# Patient Record
Sex: Male | Born: 1950 | Race: White | Hispanic: No | Marital: Married | Smoking: Former smoker
Health system: Southern US, Community
[De-identification: ages and names within clinical notes are randomized; demographics above are authoritative.]

## PROBLEM LIST (undated history)

## (undated) DIAGNOSIS — M1990 Unspecified osteoarthritis, unspecified site: Secondary | ICD-10-CM

## (undated) DIAGNOSIS — I509 Heart failure, unspecified: Secondary | ICD-10-CM

## (undated) DIAGNOSIS — G4730 Sleep apnea, unspecified: Secondary | ICD-10-CM

## (undated) DIAGNOSIS — R51 Headache: Secondary | ICD-10-CM

## (undated) DIAGNOSIS — T7840XA Allergy, unspecified, initial encounter: Secondary | ICD-10-CM

## (undated) DIAGNOSIS — Z95810 Presence of automatic (implantable) cardiac defibrillator: Secondary | ICD-10-CM

## (undated) DIAGNOSIS — R519 Headache, unspecified: Secondary | ICD-10-CM

## (undated) DIAGNOSIS — E7800 Pure hypercholesterolemia, unspecified: Secondary | ICD-10-CM

## (undated) DIAGNOSIS — Z9889 Other specified postprocedural states: Secondary | ICD-10-CM

## (undated) HISTORY — PX: HERNIA REPAIR: SHX51

---
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
## 2003-08-31 ENCOUNTER — Other Ambulatory Visit: Payer: Self-pay

## 2004-04-22 HISTORY — PX: HAND TENDON SURGERY: SHX663

## 2006-01-15 ENCOUNTER — Ambulatory Visit: Payer: Self-pay

## 2006-07-17 ENCOUNTER — Emergency Department: Payer: Self-pay | Admitting: Emergency Medicine

## 2012-07-25 ENCOUNTER — Emergency Department: Payer: Self-pay | Admitting: Emergency Medicine

## 2012-07-25 LAB — CBC WITH DIFFERENTIAL/PLATELET
Basophil #: 0 10*3/uL (ref 0.0–0.1)
Basophil %: 0.3 %
MCH: 29.3 pg (ref 26.0–34.0)
MCHC: 34.5 g/dL (ref 32.0–36.0)
MCV: 85 fL (ref 80–100)
Monocyte #: 0.8 x10 3/mm (ref 0.2–1.0)
Neutrophil #: 8.2 10*3/uL — ABNORMAL HIGH (ref 1.4–6.5)
Neutrophil %: 86.4 %
Platelet: 171 10*3/uL (ref 150–440)
RBC: 4.98 10*6/uL (ref 4.40–5.90)
RDW: 12.8 % (ref 11.5–14.5)

## 2012-07-25 LAB — BASIC METABOLIC PANEL
Anion Gap: 4 — ABNORMAL LOW (ref 7–16)
Calcium, Total: 8.7 mg/dL (ref 8.5–10.1)
Chloride: 106 mmol/L (ref 98–107)
Co2: 27 mmol/L (ref 21–32)
Creatinine: 1.05 mg/dL (ref 0.60–1.30)
EGFR (African American): >60
EGFR (Non-African Amer.): >60
Potassium: 4.1 mmol/L (ref 3.5–5.1)
Sodium: 137 mmol/L (ref 136–145)

## 2012-07-26 ENCOUNTER — Ambulatory Visit: Payer: Self-pay | Admitting: Orthopedic Surgery

## 2012-07-26 ENCOUNTER — Inpatient Hospital Stay: Payer: Self-pay | Admitting: Internal Medicine

## 2012-07-26 LAB — CBC
HCT: 39.6 % — ABNORMAL LOW (ref 40.0–52.0)
MCHC: 34.4 g/dL (ref 32.0–36.0)
MCV: 85 fL (ref 80–100)
Platelet: 153 10*3/uL (ref 150–440)
RBC: 4.66 10*6/uL (ref 4.40–5.90)
RDW: 13.1 % (ref 11.5–14.5)
WBC: 10 10*3/uL (ref 3.8–10.6)

## 2012-07-26 LAB — URINALYSIS, COMPLETE
Bilirubin,UR: NEGATIVE
Glucose,UR: NEGATIVE mg/dL (ref 0–75)
Leukocyte Esterase: NEGATIVE
Nitrite: NEGATIVE
Ph: 6 (ref 4.5–8.0)
Protein: NEGATIVE
Squamous Epithelial: <1

## 2012-07-26 LAB — COMPREHENSIVE METABOLIC PANEL
BUN: 13 mg/dL (ref 7–18)
Calcium, Total: 8 mg/dL — ABNORMAL LOW (ref 8.5–10.1)
Chloride: 106 mmol/L (ref 98–107)
Glucose: 99 mg/dL (ref 65–99)
Potassium: 4 mmol/L (ref 3.5–5.1)
SGOT(AST): 17 U/L (ref 15–37)

## 2012-07-26 LAB — SEDIMENTATION RATE: Erythrocyte Sed Rate: 8 mm/hr (ref 0–20)

## 2012-07-26 LAB — TROPONIN I: Troponin-I: <0.02 ng/mL

## 2012-07-27 LAB — CBC WITH DIFFERENTIAL/PLATELET
Basophil #: 0 10*3/uL (ref 0.0–0.1)
Basophil %: 0.3 %
Eosinophil #: 0 10*3/uL (ref 0.0–0.7)
Eosinophil %: 0.2 %
HCT: 37.3 % — ABNORMAL LOW (ref 40.0–52.0)
Lymphocyte %: 8.6 %
MCH: 29.4 pg (ref 26.0–34.0)
MCHC: 34.7 g/dL (ref 32.0–36.0)
MCV: 85 fL (ref 80–100)
Monocyte #: 1.2 x10 3/mm — ABNORMAL HIGH (ref 0.2–1.0)
Monocyte %: 12.4 %
Neutrophil #: 7.4 10*3/uL — ABNORMAL HIGH (ref 1.4–6.5)
Neutrophil %: 78.5 %
Platelet: 146 10*3/uL — ABNORMAL LOW (ref 150–440)
RBC: 4.41 10*6/uL (ref 4.40–5.90)
RDW: 13.3 % (ref 11.5–14.5)
WBC: 9.4 10*3/uL (ref 3.8–10.6)

## 2012-07-27 LAB — BASIC METABOLIC PANEL
BUN: 11 mg/dL (ref 7–18)
Co2: 30 mmol/L (ref 21–32)
Creatinine: 1.05 mg/dL (ref 0.60–1.30)
Osmolality: 274 (ref 275–301)
Potassium: 4.2 mmol/L (ref 3.5–5.1)
Sodium: 137 mmol/L (ref 136–145)

## 2012-07-27 LAB — URINE CULTURE

## 2012-07-27 LAB — TROPONIN I: Troponin-I: <0.02 ng/mL

## 2012-07-28 LAB — VANCOMYCIN, TROUGH: Vancomycin, Trough: 12 ug/mL (ref 10–20)

## 2012-07-30 LAB — WOUND CULTURE

## 2012-08-01 LAB — CULTURE, BLOOD (SINGLE)

## 2012-11-17 ENCOUNTER — Ambulatory Visit: Payer: Self-pay | Admitting: Unknown Physician Specialty

## 2013-03-12 ENCOUNTER — Ambulatory Visit: Payer: Self-pay | Admitting: Otolaryngology

## 2013-04-01 ENCOUNTER — Ambulatory Visit: Payer: Self-pay | Admitting: Otolaryngology

## 2013-04-13 ENCOUNTER — Ambulatory Visit: Payer: Self-pay | Admitting: Otolaryngology

## 2013-06-08 ENCOUNTER — Ambulatory Visit: Payer: Self-pay | Admitting: Internal Medicine

## 2013-07-30 ENCOUNTER — Ambulatory Visit: Payer: Self-pay | Admitting: Internal Medicine

## 2013-07-30 LAB — CBC CANCER CENTER
BASOS ABS: 0 x10 3/mm (ref 0.0–0.1)
BASOS PCT: 0.8 %
EOS ABS: 0.2 x10 3/mm (ref 0.0–0.7)
Eosinophil %: 3.3 %
HCT: 43.7 % (ref 40.0–52.0)
HGB: 14.5 g/dL (ref 13.0–18.0)
LYMPHS ABS: 0.9 x10 3/mm — AB (ref 1.0–3.6)
Lymphocyte %: 18.2 %
MCH: 28.2 pg (ref 26.0–34.0)
MCHC: 33.2 g/dL (ref 32.0–36.0)
MCV: 85 fL (ref 80–100)
Monocyte #: 0.4 x10 3/mm (ref 0.2–1.0)
Monocyte %: 8 %
NEUTROS ABS: 3.5 x10 3/mm (ref 1.4–6.5)
Neutrophil %: 69.7 %
PLATELETS: 193 x10 3/mm (ref 150–440)
RBC: 5.15 10*6/uL (ref 4.40–5.90)
RDW: 13.5 % (ref 11.5–14.5)
WBC: 5 x10 3/mm (ref 3.8–10.6)

## 2013-07-30 LAB — IRON AND TIBC
IRON BIND. CAP.(TOTAL): 342 ug/dL (ref 250–450)
Iron Saturation: 25 %
Iron: 84 ug/dL (ref 65–175)
Unbound Iron-Bind.Cap.: 258 ug/dL

## 2013-08-02 LAB — PROT IMMUNOELECTROPHORES(ARMC)

## 2013-08-02 LAB — KAPPA/LAMBDA FREE LIGHT CHAINS (ARMC)

## 2013-08-20 ENCOUNTER — Ambulatory Visit: Payer: Self-pay | Admitting: Internal Medicine

## 2014-05-12 HISTORY — PX: OTHER SURGICAL HISTORY: SHX169

## 2014-08-12 NOTE — Consult Note (Signed)
Consult was dictated, patient is well known to me. He has ruled out for MI, and has LBBB, of uncertain age. Chest pain yesterday was atypical due to probably b/l lower lobe pneumonia. His right knee is being treated with antiobiotics, and if needs surgery advise proceeding with surgery. He is low risk for surgery, as LVEF on echo was around 50 %. No significant valvular abnormalities and no significant wall motion abnormalities. Will see out patient if further cardiac testing needed. Advise getting over acute phase of treatment for pneumonia and knee infection first. Thanks.   Electronic Signatures: Angelica Ran (MD) (Signed on 07-Apr-14 16:49)  Authored   Last Updated: 07-Apr-14 16:50 by Angelica Ran (MD)

## 2014-08-12 NOTE — H&P (Signed)
DATE OF BIRTH:  06/19/1950  DATE OF ADMISSION:  07/26/2012  PRIMARY CARE PHYSICIAN:  Dr. Apolonio Schneiders  CHIEF COMPLAINT:  "I thought I had the flu."   HISTORY OF PRESENT ILLNESS:  This is a 64 year old man who last Tuesday or Wednesday felt like he had the flu, very weak, not feeling well. Friday and Saturday, he felt terrible with major headache. Nothing would help. Nausea, cold chills and fever. Had some diarrhea on the weekend. On Saturday, he noticed his knee was red. He came to the ER. 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 He had a cortisone shot actually a couple of months ago by Dr. Tamala Julian. He came to the ER on Saturday, was given antibiotic to go home with, but had a fever of 101.6, worsening erythema around the swollen knee. Hospitalist services were contacted for further evaluation.   PAST MEDICAL HISTORY:  Sleep apnea.   PAST SURGICAL HISTORY:  Knee surgery, two hernias.   ALLERGIES:  No known drug allergies.   MEDICATIONS:  Include Claritin 10 mg daily, etodolac twice a day, Flonase 2 sprays each nostril daily, over-the-counter sinus medication, ibuprofen PM, and CPAP at night.   SOCIAL HISTORY: Quit smoking 37 years ago. Rare alcohol. No drug use. Works in Architect.   FAMILY HISTORY:  Father died of lung cancer. Mother is healthy. Brother just died of metastatic esophageal cancer.  Another brother has a benign tumor on the carotid artery.   REVIEW OF SYSTEMS:   CONSTITUTIONAL: Positive for fever. Positive for chills. Positive for sweats. Positive for weight loss, trying to do so. Positive for fatigue. Positive for headache.  EYES: He does wear glasses. No blurry vision.  EARS, NOSE, MOUTH AND THROAT:  Decreased hearing. Positive for runny nose. No sore throat. No difficulty swallowing.  CARDIOVASCULAR:  No chest pain. No palpitations.  RESPIRATORY:  Positive for shortness of breath. Positive for cough.   GASTROINTESTINAL:  Positive for nausea. Positive for vomiting. Positive for diarrhea. No bright red blood per rectum. No melena. GENITOURINARY:  No burning on urination. No hematuria.  MUSCULOSKELETAL: Positive for joint pain of the right knee and joint pains all over.  INTEGUMENT:  Positive for erythema of the right knee.  NEUROLOGICAL:  No fainting or blackouts.  PSYCHIATRIC:  No anxiety or depression.  ENDOCRINE:  No thyroid problems.  HEMATOLOGIC, LYMPHATIC:  No anemia. No easy bruising or bleeding.   PHYSICAL EXAMINATION: VITAL SIGNS: Temperature 100.4, pulse 78, respirations 16, blood pressure 125/69, pulse ox 96% on room air.  GENERAL:  Ill-looking male lying in bed.  EYES: Conjunctivae and lids normal. Pupils equal, round and reactive to light. Extraocular muscles intact. No nystagmus.  EARS, NOSE, MOUTH AND THROAT: Tympanic membranes bilaterally erythematous and bulging. Nasal mucosa:  No erythema. Throat:  No erythema, no exudate seen. Lips and gums:  No lesions.  NECK: No JVD. No bruits. No lymphadenopathy. No thyromegaly. No thyroid nodules palpated.  RESPIRATORY:  Lungs clear to auscultation. No use of accessory muscles to breathe. No rhonchi, rales or wheeze heard.  CARDIOVASCULAR SYSTEM:  S1, S2 normal. No gallops, rubs or murmurs heard. Carotid upstroke 2+ bilaterally. No bruits.  EXTREMITIES: Dorsalis pedis pulses 2+ bilaterally. No edema of the lower extremity.  ABDOMEN: Soft, nontender. No organo-/splenomegaly. Normoactive bowel sounds. No masses felt.  LYMPHATIC:  No lymph nodes in the neck. Positive lymph nodes in the right groin.  MUSCULOSKELETAL:  Right knee swollen. Painful range of motion secondary to swelling in the prepatellar bursa.  SKIN:  Erythema around the right knee.  NEUROLOGIC:  Cranial nerves II through XII grossly intact.  PSYCHIATRIC:  Patient is oriented to person, place and time.   LABORATORY AND RADIOLOGICAL DATA:  Urinalysis negative. White blood  cell count 10.0, H and H 13.7 and 39.6, platelet count 153. Glucose 99, BUN 13, creatinine 0.95, sodium 138, potassium 4.0, chloride 106, CO2 of 27, calcium 8.0. Liver function tests:  Normal range. Lactic acid 0.7. Chest x-ray shows mild atelectasis versus pneumonia of the left lung base. CT scan of the head done yesterday showed no acute intracranial abnormality.   ASSESSMENT AND PLAN: 1.  Infected patellar bursa. Emergency Room physician did an incision and drainage and sent off a culture. Will get Orthopedic evaluation for consideration of incision and drainage. Will give IV vancomycin and IV Levaquin at this point. Will get an ultrasound of the right lower extremity to rule out deep vein thrombosis. Blood cultures x 2 sent off. Will send off a CRP and ESR.  2.  Upper respiratory tract infection with otitis media, possibility of pneumonia. Levaquin will cover. Blood cultures sent off by ER physician.  3. Headache, likely cluster headache. Oxygen made it feel better. Will continue oxygen supplementation, p.r.n. pain medications.  4.  Sleep apnea. CPAP at night.    Time spent on admission:  55 minutes.    ____________________________ Tana Conch. Leslye Peer, MD rjw:mr D: 07/26/2012 18:47:00 ET T: 07/26/2012 19:04:07 ET JOB#: [id number]  cc: Tana Conch. Leslye Peer, MD, <Dictator> Vianne Bulls. Arline Asp, MD Marisue Brooklyn MD ELECTRONICALLY SIGNED 07/27/2012 15:21

## 2014-08-12 NOTE — Consult Note (Signed)
Brief Consult Note: Diagnosis: CP with new L BBB.   Patient was seen by consultant.   Consult note dictated.   Comments: Patient with fever, R knee pain/swelling and septic arthritis. He has SOB with inspiration as well as SOB with activity and fatigue. EKG with NSR new LBBB compared to 2007. TNI neg thus far. Will get echo to check wall motion abnormality and will consider Lexiscan stress testing.  Electronic Signatures: Angelica Ran (MD)   (Signed 07-Apr-14 16:40)  Co-Signer: Brief Consult Note Gabriel Rung A (PA-C)   (Signed 07-Apr-14 09:37)  Authored: Brief Consult Note  Last Updated: 07-Apr-14 16:40 by Angelica Ran (MD)

## 2014-08-12 NOTE — Consult Note (Signed)
PATIENT NAME:  Luke Hess, Luke Hess MR#:  [id number] DATE OF BIRTH:  27-May-1950  DATE OF CONSULTATION:  07/26/2012  REFERRING PHYSICIAN:   CONSULTING PHYSICIAN:  Maebelle Munroe, MD  CHIEF COMPLAINT: Right knee pain.  "I will redictate this appeared to"  DICTATION INCOMPLETE  ____________________________ Maebelle Munroe, MD jfs:ct D: 08/14/2012 09:51:47 ET T: 08/14/2012 10:22:00 ET JOB#: [id number]  cc: Maebelle Munroe, MD, <Dictator> Maebelle Munroe MD ELECTRONICALLY SIGNED 08/21/2012 11:19

## 2014-08-12 NOTE — Discharge Summary (Signed)
PATIENT NAME:  Luke Hess, Luke Hess MR#:  [id number] DATE OF BIRTH:  [birth year]  DATE OF ADMISSION:  07/26/2012 DATE OF DISCHARGE:  07/28/2012  DISCHARGE DIAGNOSES: 1. Prepatellar bursitis, infected, status post incision and drainage. 2.  New left bundle branch block, cardiology followed.  3.  Chronic diastolic congestive heart failure.  4.  Possible infiltrate in the left lower base, early pneumonia.  5.  Sleep apnea. 6.  Right knee infected in prepatellar bursa status post incision and drainage.  CODE STATUS ON DISCHARGE: FULL CODE.   DISCHARGE MEDICATIONS:  1.  Flonase 50 mcg nasal spray each nostril once a day. 2.  Ibuprofen 600 mg oral tablet every 6 hours as needed for pain. 3.  Amoxicillin clavulanate 875/125 mg oral tablet every 12 hours for 10 days.  PHYSICIAN INSTRUCTIONS ON DISCHARGE:  Home health:  No.  Dressing care:  Keep dressing dry.  Need to go to clinic to change it in 3 days. Diet:  Regular.  Diet consistency:  Regular. Activity:  As tolerated.  Time to followup:  In 1 to 2 days in ortho clinic and in 1 to 2 days in cardiology clinic.    HISTORY OF PRESENT ILLNESS:  1. The patient is a 64 year old male who had symptoms of flu-like and felt very weak 3 to 4 days before presentation and he started feeling febrile with major headache. He had nausea, cold chills and fever, also had some diarrhea, and after that he noticed that his knee was red and so he decided to come to the Emergency Room. He had a sore on his knee for weeks. 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 He had a cortisone shot 2 to 3 months ago by Dr. Tamala Julian.  When he came to the Emergency Room, he was given antibiotic to go home, but he had fever of 101 degrees Fahrenheit, worsening erythema around the knee, and so he came back to the Emergency Room and admitted for prepatellar bursitis infection. Emergency Room physician did incision and drainage and sent off a culture and  started on vancomycin and Levaquin. Ultrasound of the right lower extremity was also done to rule out any deep vein thrombosis. Blood cultures were sent off. Orthopedic consult was done.  Orthopedic surgeon came and he did further incision and drainage and cleanup and then did dressing change the next day and found the wound healing nicely and improving from infection and so advised to discharge the patient to follow up in the clinic, on orthopedics, within 3 days to change the dressing.  2. Upper respiratory tract infection with otitis media, possibly.  There was pneumonia also on chest x-ray and so we started on Levaquin for the wound, which might help for pneumonia also, and discharged on Augmentin. That will take care of pneumonia too.  3.  Headache, likely it was cluster headache. Oxygen made him feel better and overall his infection came under control. He continued feeling better with that.  4.  Sleep apnea. He was using CPAP at night. We continued here in the hospital.   CONSULTANTS:  While in the hospital, cardiology was called in because he had left bundle branch block which was not present in our system. It was an EKG that was 4 years ago. So cardiology was called and they did an echocardiogram. He had diastolic heart failure. Troponins were negative and there were no acute symptoms so they suggested to follow up in the  clinic and they will do the stress test in the clinic.  IMPORTANT LAB AND DIAGNOSTIC RESULTS:  In the hospital:  Creatinine remained normal around 0.9 and 1.  All 3 troponins negative, less than 0.02. WBC on presentation 10,000, which remained stable at 9400. Hemoglobin was stable around 13.   Wound culture:  No growth.  Blood culture: No growth.  Another wound culture, which was sent by orthopedic after I and D, grew moderate growth of Streptococcus pyogens.    Echocardiogram:  Left ventricular ejection fraction by visual estimate 50% to 55%, mild decreased global left  ventricular systolic function, Abnormal septal motion consistent with left bundle branch block, impaired relaxation pattern of LV diastolic filling, mild left ventricular hypertrophy, mildly dilated left atrium, mildly dilated right ventricle, mildly increased left ventricular internal cavity size and moderate increased left ventricular posterior wall thickness.  No significant valvular disease.  TOTAL TIME SPENT ON DISCHARGE: 45 minutes.  ____________________________ Ceasar Lund Anselm Jungling, MD vgv:sb D: 07/31/2012 07:57:00 ET    T: 07/31/2012 09:04:56 ET        JOB#: [id number] cc: Ceasar Lund. Anselm Jungling, MD, <Dictator> Laurene Footman, MD Dionisio David, MD Vaughan Basta MD ELECTRONICALLY SIGNED 08/02/2012 22:19

## 2014-08-12 NOTE — Op Note (Signed)
PATIENT NAME:  Luke Hess, Luke Hess MR#:  [id number] DATE OF BIRTH:  Feb 05, 1951  DATE OF PROCEDURE:  07/26/2012  PREOPERATIVE DIAGNOSIS: Right knee septic prepatellar bursitis.  POSTOPERATIVE DIAGNOSIS: Right knee septic prepatellar bursitis.  PROCEDURES PERFORMED:  Right knee irrigation and debridement down to the level of the fascia.   SURGEON:  Maebelle Munroe, MD  COMPLICATIONS:  None.  Dictation ended here.  Please see addendum for continuation of dictation.    ____________________________ Maebelle Munroe, MD jfs:sb D: 08/14/2012 11:22:00 ET T: 08/14/2012 11:37:35 ET JOB#: [id number]  cc: Maebelle Munroe, MD, <Dictator> Maebelle Munroe MD ELECTRONICALLY SIGNED 08/21/2012 11:19

## 2014-08-12 NOTE — Consult Note (Signed)
PATIENT NAME:  Luke Hess, Luke Hess MR#:  [id number] DATE OF BIRTH:  1950-05-03  DATE OF CONSULTATION:  07/26/2012  REFERRING PHYSICIAN:   CONSULTING PHYSICIAN:  Maebelle Munroe, MD  CHIEF COMPLAINT: Right knee pain.   HISTORY OF PRESENT ILLNESS: Luke Hess is 64 year old male for whom I was consulted for right knee pain.  He denies any specific history of injury. 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 He was seen in the Emergency Department yesterday and underwent limited irrigation and debridement with minimal fluid drainage from the I and D, per the hospitalist service, and presented with complaints of fever to 101.6. He complains of sharp, constant pain of the anterior aspect of the knee as well as some chills and fever.  REVIEW OF SYSTEMS:   CARDIAC: No chest pain.  RESPIRATORY: No shortness of breath. GENERAL:  Complains of fevers and chills.  GASTROINTESTINAL: Some nausea.  MUSCULOSKELETAL: Complains of anterior progressive right knee pain.  SKIN: He states that he did have a sore in the region of his anterior knee over the past few weeks from working on his knees a lot.  He also states that he has had a cortisone shot in the anterior aspect of his knee in the last 1 or 2 months. All other review of systems are negative.   PAST MEDICAL HISTORY: Remarkable for sleep apnea.   PAST SURGICAL HISTORY: Knee surgery and herniorrhaphy.   ALLERGIES: No known drug allergies.   CURRENT MEDICATIONS: Include Claritin, etodolac, Flonase, over-the-counter sinus medications and ibuprofen.   SOCIAL HISTORY: Rare alcohol use. Nonsmoker. Works in Architect.   FAMILY HISTORY: Positive for lung cancer and esophageal cancer.   PHYSICAL EXAMINATION:  GENERAL: Pleasant, alert and cooperative male appearing his stated age and appearing with his wife and daughter who is a Marine scientist in the hospital.  VITAL SIGNS: Temperature 100.4,  pulse 78, respiratory rate 16, blood pressure 125/69 and pulse ox 96% on room air.  PSYCHIATRIC: Mood and affect appropriate.  HEENT:  Normocephalic, atraumatic. Sclerae clear. Oral mucosa membranes are slightly dry.  LUNGS: Clear to auscultation bilaterally.  HEART: Regular rate and rhythm. Normal S1, S2.  No murmurs or gallops.  ABDOMEN: Soft.  Positive bowel sounds.  LYMPHATIC: Moderate swelling of the anterior aspect of the right knee. Mild tenderness to palpation in the right groin.  No lymphangitic streaks noted.  VASCULAR: 2+ dorsalis pedis and posterior tibialis pulse, right lower extremity.  NEUROLOGIC: Motor and light touch sensation intact in the right lower extremity.  SKIN: Small less than 1 cm skin defect over the anterolateral aspect of the knee.  MUSCULOSKELETAL: Of the right knee, demonstrates no appreciable effusion, moderate anterior swelling of the knee over the prepatellar bursa with moderate erythema over approximately a 5 x 5 cm region of the anterior aspect of the knee. There is trace expressible discharge from the previous I and D spot, from the Emergency Department. There is moderate tenderness to palpation over the anterior aspect of the knee with moderate fluctuance of the prepatellar bursa, especially between the distal patella and the tibial tubercle.  Range of motion of the knee, 10 to 90 with minimal pain, past 90 moderate pain.  DIAGNOSTICS:  Laboratory evaluation shows hemoglobin and hematocrit of 13.7 and 39.6 with white blood cell count of 10.    Chemistries are essentially unremarkable. Troponin is less than 0.02.   Cultures obtained from the  right knee demonstrated rare gram-positive cocci.   Addendum:  Eventual cultures did grow group A strep, moderate growth, with normal skin flora and no anaerobic growth.   C-reactive protein is 109 with upper limits of normal at 5.   PROCEDURE NOTE: Please see separately dictated operative note for I and D of the right  knee.  IMPRESSION: Right knee septic bursitis.   PLAN: Will perform limited incision and drainage in the Emergency Department today. Risks, benefits and alternatives were discussed of that with the patient to include but not limited to bleeding, infection, recurrent infection, recurrent septic bursitis, limitation of stiffness, deep venous thrombosis and pulmonary embolus, failure of procedure requiring more extensive irrigation and debridement. He and his wife appeared to understand this and desired to proceed with operative treatment in Emergency Department.  ____________________________ Maebelle Munroe, MD jfs:sb D: 08/14/2012 11:21:08 ET T: 08/14/2012 11:40:41 ET JOB#: [id number]  cc: Maebelle Munroe, MD, <Dictator> Maebelle Munroe MD ELECTRONICALLY SIGNED 08/21/2012 11:15

## 2014-08-12 NOTE — Op Note (Signed)
PATIENT NAME:  Luke Hess, Luke Hess MR#:  [id number] DATE OF BIRTH:  [birth year]  DATE OF PROCEDURE:  04/01/2013  PREOPERATIVE DIAGNOSIS:  Nasal obstruction secondary to septal deformity and bilateral inferior turbinate hypertrophy.  POSTOPERATIVE DIAGNOSIS:  Nasal obstruction secondary to septal deformity and bilateral inferior turbinate hypertrophy.  PROCEDURES: Septoplasty. Bilateral submucous resection of the inferior turbinates.   SURGEON:  Janalee Dane, MD  ANESTHESIA:  General  FINDINGS:  The septum was deviated in a reversed J type deformity with extension back to the junction between the perpendicular plate of the ethmoid and vomer bones. Inferior turbinates were massively hypertrophied.  DESCRIPTION OF PROCEDURE:  The patient was identified in the holding area and was brought back to the operating room in the supine position on the operating room table.  After general endotracheal anesthesia had been induced the patient was turned 90 degrees counter clockwise from anesthesia.  The nose was anesthetized with infraorbital nerve blocks and septal injection with 0.5% Lidocaine and 0.25% Bupivacaine mixed with 1:150,000 with Epinephrine and phenylephrine Lidocaine soaked pledgets, two on each side were placed and the face was prepped and draped in the usual fashion.  The pledgets were removed.  A 15 blade was used to make a left-sided hemitransfixion incision and septal mucoperichondrial mucoperiosteal leaflets elevated.  There was a large inferior spur that was resected with Jansen-Middleton forceps.  The remaining septum was deviated back and forth in an accordion like fashion.  The bony cartilaginous junction was then divided and a moderate amount of vomer and perpendicular plate was taken down with Jansen-Middleton forceps, releasing the tension on the remaining septum.  The septum then swung back into the midline.  The septal leaflets were closed with quilting 4-0 chromic suture.  The left  sided hemitransfixion incision was closed with 4-0 plain gut.  Attention was directed to the turbinates which had been previously injected on the left.  The head of the inferior turbinate on the left was incised with a 15 blade and the medial mucoperiosteum was elevated using a Psychologist, educational.  Once this had been elevated Knight scissors were used to resect the conchal bone and lateral mucoperiosteum.  The inferior margin of the remaining mucoperiosteum was then cauterized with suction cautery and Surgiflo was placed at the inferior to the inferior margin of the remaining inferior turbinate.  A total of amount of 1 unit of Surgiflo was placed. An identical procedure was performed on the right inferior turbinate with once again placement of Surgiflo along its inferior margin.  Temporary Telfa pledgets were then placed.  The patient was allowed to emerge from anesthesia, extubated in the operating room and taken to the recovery room in stable condition.  There were no complications.  Estimated blood loss was less than 10 milliliters. ____________________________ Lenna Sciara. Nadeen Landau, MD jmc:sb D: 04/01/2013 07:58:05 ET T: 04/01/2013 08:16:14 ET JOB#: [id number]  cc: Janalee Dane, MD, <Dictator> Nicholos Johns MD ELECTRONICALLY SIGNED 04/02/2013 8:10

## 2014-08-12 NOTE — Op Note (Signed)
PATIENT NAME:  Luke Hess, Luke Hess MR#:  [id number] DATE OF BIRTH:  04-10-1951  DATE OF PROCEDURE:  07/26/2012  PREOP DX: R knee septic bursitis  POSTOP DX: same  PROCEDURE: r knee irrigation/debridement  SURGEON: Lana Flaim  ASSISTANTS: none  ANES: none  FINDINGS: moderate purulence R knee prepatellar bursa  EBL: minimal (<20cc)  MATERIALS TO LAB: cultures R knee bursa  SPECIMENS: NONE  DRAINS: NONE  INDICATIONS: Mr. Breden has had progressive right knee pain over the last 1 to 2 weeks with continued pain, swelling and fever in the anterior aspect of the right knee following limited irrigation and debridement, performed in the Emergency Department yesterday. The risks, benefits and alternatives were discussed with he and his wife to include but not limited to bleeding, infection, recurrent infection, damage to blood vessels requiring need for further surgery and treatment, DVT and PE and stiffness of the knee. He understood these risks and benefits as well as the need for potentially more extensive operative irrigation and debridement in the future and desired to proceed with surgical treatment.   DESCRIPTION OF PROCEDURE: After confirmation of correct procedural site, Betadine prep and isolation of the correct surgical site, with the previous irrigation and debridement site utilized, a hemostat was used using sterile technique to break up multiple septated loculations over the anterior aspect of the knee with immediate release of cloudy purulent fluid from the prepatellar bursa region. Multiple passes of the hemostat were used as well as a Q-tip, and ultimately anaerobic and aerobic cultures for Gram stain.  Iodoform 1/4 inch packing was then used and sterile a dressing was applied along with a knee immobilizer. Cultures were sent for final cultures. The patient tolerated the procedure well.  After the sterile dressing and Ace wrap were placed and knee immobilizer these findings were explained  to the patient's wife and daughter and he will remain inpatient for IV antibiotics, especially for MRSA  coverage initially. ____________________________ Maebelle Munroe, MD jfs:sb D: 08/14/2012 11:25:22 ET T: 08/14/2012 12:07:57 ET JOB#: [id number]  cc: Maebelle Munroe, MD, <Dictator> Maebelle Munroe MD ELECTRONICALLY SIGNED 08/21/2012 11:19

## 2014-08-12 NOTE — Consult Note (Signed)
PATIENT NAME:  Luke Hess, Luke Hess MR#:  [id number] DATE OF BIRTH:  08/31/1950  DICTATED FOR: Neoma Laming, MD  DATE OF CONSULTATION:  07/27/2012  PHYSICIAN REQUESTING CONSULTATION: Dr. Leslye Peer.  PRIMARY CARE PHYSICIAN: Apolonio Schneiders, MD   REASON FOR CONSULTATION: Chest pain, new left bundle branch block.   HISTORY OF PRESENT ILLNESS: The patient is a 64 year old male who was originally seen in the Emergency Room for swelling and pain in the right knee. He was discharged on antibiotics; however, he continued to decline with fever, headache and diarrhea. He was brought back to the hospital. During his hospital admission, he was found to have a painful, red, inflamed right knee with some associated lymphadenopathy and septic bursitis. During his stay he has complained of chest tightness that seems to be worse with inspiration, nonproductive cough that is associated with this, and headache. He notes that for the last couple of weeks, he has had some tightness in his chest and associated shortness of breath that is sometimes worse with walking or exerting himself. He has also noticed he is fatigued more easily. The patient denies any palpitations, rapid heart rate, orthopnea, PND or pedal edema.   Of note, the patient has been feeling with fevers, with chills, and also has had some diarrhea.   PAST MEDICAL HISTORY:  1.  Sleep apnea.  2.  Chronic headache and sinus problems.  3.  Sleep apnea and uses CPAP nightly.   PAST SURGICAL HISTORY:  1.  Left knee arthroscopic surgery.  2.  Right knee pain with corticosteroid injections done by Dr. Tamala Julian.  3.  Inguinal hernia repair.   ALLERGIES: No known drug allergies.   HOME MEDICATIONS: 1.  Claritin 10 mg p.o. daily.  2.  Etodolac, (unknown dose) twice daily.  3.  Flonase 2 sprays to each nostril daily.  4.  Over-the-counter sinus medicines as needed.  5.  Ibuprofen PM as needed.   SOCIAL HISTORY: The patient has been married for 40 years. He quit  smoking 37 years ago. Rarely uses alcohol. Denies illicit drug use. 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   FAMILY HISTORY: Maternal aunt with coronary artery disease. Father died of lung cancer. Brother died of metastatic esophageal cancer. Youngest brother has a genetic benign tumor that has affected the lungs, kidneys, and has now wrapped around the carotid artery.   REVIEW OF SYSTEMS:  CONSTITUTIONAL: Positive for fever, chills, sweats, fatigue, headache.  EYES: The patient wears glasses. Denies blurry vision.   EARS, NOSE, MOUTH AND THROAT: Decreased hearing, runny nose/rhinorrhea.  CARDIOVASCULAR: The patient complains of chest tightness, as mentioned in the history of present illness. No palpitations.  RESPIRATORY: The patient has shortness of breath, a nonproductive cough.  GASTROINTESTINAL: Positive for nausea, diarrhea.  MUSCULOSKELETAL: Positive for joint pain in the right knee and joint pains throughout his body.   PHYSICAL EXAMINATION: GENERAL: This is a pleasant male who is not in any acute distress. He is alert and oriented x3.  VITAL SIGNS: With a temperature of 96.9 degrees Fahrenheit, heart rate is 75, respiratory rate is 17, blood pressure 107/67, O2 sat is 94% on room air.  HEENT: Head atraumatic, normocephalic.  EYES: Pupils round and equal. Conjunctivae are pale, pink. Ears and nose are normal to external inspection.  MOUTH: Good dentition. Moist mucous membranes.  NECK: Supple. No carotid bruits.  LUNGS: No accessory muscle use. Lungs clear to auscultation.  CARDIOVASCULAR: Regular rate and rhythm. No murmurs, rubs  or gallops noted.  ABDOMEN: Soft, nontender, with bowel sounds present in all 4 quadrants.  EXTREMITIES: The patient has a large brace on the right leg and foot. No pedal edema noted in the left foot.   ANCILLARY DATA: EKG on admission with a new left bundle branch block; otherwise normal sinus rhythm and no acute ST/T  changes.   LABORATORY DATA: Glucose of 108, BUN 11, creatinine 1.05, sodium 137, potassium 4.2, chloride 105, CO2 is 30. Total calcium is 7.9, total protein is 6.4, albumin 3.4, total bilirubin 1.0, alkaline phosphatase 79, AST 17, ALT 24. Troponin I is less than 0.02 x3. White blood cell count is 9.4, hemoglobin is 13.0, hematocrit 37.3, platelets 146,000. Urine culture is no growth to date. Lactic acid is 0.7.   RADIOLOGICAL DATA: CT of chest: No evidence of acute pulmonary embolism, possible early pneumonia, 2 mm nodule posterolaterally in the right middle lobe which is nonspecific.   Ultrasound of the right lower extremity: No evidence of deep vein thrombosis.   Chest x-ray: Mild atelectasis versus pneumonia in the left lung base.   ASSESSMENT/PLAN: 1. Chest pain with new left bundle branch block. The patient complains of some intermittent chest pressure/tightness that could be secondary to respiratory pathology since it is worse with inspiration. He has had some increased shortness of breath while walking/working, but has not had any heaviness or pressure in his chest. Troponins were negative x3; and his EKG with no ST or T wave abnormalities, although this is difficult to discern with left bundle branch block. He did not have left bundle branch block on EKG done in 2007. We will do echocardiogram to assess wall motion; and if needed prior to possible orthopedic surgery, we can get LexiScan stress test.  2.  Sepsis, secondary to right prepatellar bursitis, the patient currently on antibiotics.  3.  Sleep apnea, using continuous positive airway pressure machine.   Thank you very much for this consultation and allowing [place] to participate in this patient's care. We will continue to follow this patient with you.     ____________________________ Merla Riches, PA-C mam:dm D: 07/27/2012 09:45:00 ET T: 07/27/2012 10:26:24 ET JOB#: [id number]  cc: Vianne Bulls. Arline Asp, MD Merla Riches, PA-C,  <Dictator>      Avigayil Ton [facility] ELECTRONICALLY SIGNED 07/28/2012 8:15

## 2014-08-12 NOTE — Consult Note (Signed)
Brief Consult Note: Diagnosis: R knee septic prepatellar bursitis.   Patient was seen by consultant.   Consult note dictated.   Recommend to proceed with surgery or procedure.   Orders entered.   Discussed with Attending MD.   Comments: bedside i/d done w packing ace wrap/knee immobilizer agree with vanc/bs abx will d/w dr Rudene Christians.  Electronic Signatures: Maebelle Munroe (MD)  (Signed 06-Apr-14 19:52)  Authored: Brief Consult Note   Last Updated: 06-Apr-14 19:52 by Maebelle Munroe (MD)

## 2015-07-20 ENCOUNTER — Encounter: Payer: Self-pay | Admitting: *Deleted

## 2015-07-21 ENCOUNTER — Ambulatory Visit: Payer: BLUE CROSS/BLUE SHIELD | Admitting: Anesthesiology

## 2015-07-21 ENCOUNTER — Ambulatory Visit
Admission: RE | Admit: 2015-07-21 | Discharge: 2015-07-21 | Disposition: A | Discharge status: Home / Self Care | Payer: BLUE CROSS/BLUE SHIELD | Source: Ambulatory Visit | Attending: Gastroenterology | Admitting: Gastroenterology

## 2015-07-21 ENCOUNTER — Encounter: Payer: Self-pay | Admitting: *Deleted

## 2015-07-21 ENCOUNTER — Encounter
Admission: RE | Disposition: A | Discharge status: Home / Self Care | Payer: Self-pay | Source: Ambulatory Visit | Attending: Gastroenterology

## 2015-07-21 DIAGNOSIS — R195 Other fecal abnormalities: Secondary | ICD-10-CM | POA: Diagnosis present

## 2015-07-21 DIAGNOSIS — M1990 Unspecified osteoarthritis, unspecified site: Secondary | ICD-10-CM | POA: Diagnosis not present

## 2015-07-21 DIAGNOSIS — E7800 Pure hypercholesterolemia, unspecified: Secondary | ICD-10-CM | POA: Insufficient documentation

## 2015-07-21 DIAGNOSIS — Z9109 Other allergy status, other than to drugs and biological substances: Secondary | ICD-10-CM | POA: Insufficient documentation

## 2015-07-21 DIAGNOSIS — Z87891 Personal history of nicotine dependence: Secondary | ICD-10-CM | POA: Insufficient documentation

## 2015-07-21 DIAGNOSIS — Z79899 Other long term (current) drug therapy: Secondary | ICD-10-CM | POA: Diagnosis not present

## 2015-07-21 DIAGNOSIS — I509 Heart failure, unspecified: Secondary | ICD-10-CM | POA: Diagnosis not present

## 2015-07-21 DIAGNOSIS — C19 Malignant neoplasm of rectosigmoid junction: Secondary | ICD-10-CM | POA: Insufficient documentation

## 2015-07-21 DIAGNOSIS — D122 Benign neoplasm of ascending colon: Secondary | ICD-10-CM | POA: Insufficient documentation

## 2015-07-21 DIAGNOSIS — Z9889 Other specified postprocedural states: Secondary | ICD-10-CM | POA: Diagnosis not present

## 2015-07-21 DIAGNOSIS — D123 Benign neoplasm of transverse colon: Secondary | ICD-10-CM | POA: Insufficient documentation

## 2015-07-21 DIAGNOSIS — Z7951 Long term (current) use of inhaled steroids: Secondary | ICD-10-CM | POA: Diagnosis not present

## 2015-07-21 HISTORY — DX: Pure hypercholesterolemia, unspecified: E78.00

## 2015-07-21 HISTORY — DX: Other specified postprocedural states: Z98.890

## 2015-07-21 HISTORY — DX: Allergy, unspecified, initial encounter: T78.40XA

## 2015-07-21 HISTORY — PX: COLONOSCOPY WITH PROPOFOL: SHX5780

## 2015-07-21 HISTORY — DX: Headache, unspecified: R51.9

## 2015-07-21 HISTORY — DX: Unspecified osteoarthritis, unspecified site: M19.90

## 2015-07-21 HISTORY — DX: Heart failure, unspecified: I50.9

## 2015-07-21 HISTORY — DX: Headache: R51

## 2015-07-21 SURGERY — COLONOSCOPY WITH PROPOFOL
Anesthesia: General

## 2015-07-21 MED ORDER — SODIUM CHLORIDE 0.9 % IV SOLN
INTRAVENOUS | Status: DC
  Administered 2015-07-21: 1000 mL via INTRAVENOUS

## 2015-07-21 MED ORDER — PROPOFOL 500 MG/50ML IV EMUL
INTRAVENOUS | Status: DC | PRN
Start: 2015-07-21 — End: 2015-07-21
  Administered 2015-07-21: 120 ug/kg/min via INTRAVENOUS

## 2015-07-21 MED ORDER — LIDOCAINE HCL (CARDIAC) 20 MG/ML IV SOLN
INTRAVENOUS | Status: DC | PRN
  Administered 2015-07-21: 60 mg via INTRAVENOUS

## 2015-07-21 MED ORDER — GLYCOPYRROLATE 0.2 MG/ML IJ SOLN
INTRAMUSCULAR | Status: DC | PRN
  Administered 2015-07-21: .2 mg via INTRAVENOUS

## 2015-07-21 MED ORDER — EPHEDRINE SULFATE 50 MG/ML IJ SOLN
INTRAMUSCULAR | Status: DC | PRN
  Administered 2015-07-21 (×2): 10 mg via INTRAVENOUS
  Administered 2015-07-21: 5 mg via INTRAVENOUS

## 2015-07-21 MED ORDER — PROPOFOL 10 MG/ML IV BOLUS
INTRAVENOUS | Status: DC | PRN
  Administered 2015-07-21: 50 mg via INTRAVENOUS

## 2015-07-21 MED ORDER — PHENYLEPHRINE HCL 10 MG/ML IJ SOLN
INTRAMUSCULAR | Status: DC | PRN
Start: 2015-07-21 — End: 2015-07-21
  Administered 2015-07-21 (×6): 100 ug via INTRAVENOUS

## 2015-07-21 NOTE — Op Note (Signed)
[facility] Patient Name: Luke Hess Procedure Date: 07/21/2015 2:07 PM MRN: [id number] Account #: [id number] Date of Birth: [birth year] Admit Type: Outpatient Age: 65 Room: [facility] ENDO ROOM 4 Gender: Male Note Status: Finalized Procedure:            Colonoscopy Indications:          This is the patient's first colonoscopy, Heme positive                        stool Patient Profile:      This is a 65 year old male. Providers:            Gerrit Heck. Rayann Heman, MD Referring MD:         Caprice Renshaw (Referring MD) Medicines:            Propofol per Anesthesia Complications:        No immediate complications. Estimated blood loss:                        Minimal. Procedure:            Pre-Anesthesia Assessment:                       - Prior to the procedure, a History and Physical was                        performed, and patient medications, allergies and                        sensitivities were reviewed. The patient's tolerance of                        previous anesthesia was reviewed.                       After obtaining informed consent, the colonoscope was                        passed under direct vision. Throughout the procedure,                        the patient's blood pressure, pulse, and oxygen                        saturations were monitored continuously. The Olympus                        CF-H180AL colonoscope ( S#: [id number] ) was introduced                        through the anus and advanced to the the cecum,                        identified by appendiceal orifice and ileocecal valve.                        The colonoscopy was performed without difficulty. The                        patient tolerated the procedure well. The quality of  the bowel preparation was good. Findings:      The perianal and digital rectal examinations were normal.      A polypoid non-obstructing mass was found in the recto-sigmoid  colon.       The mass was non-circumferential. In addition, its diameter measured       twenty mm. Oozing was present. Biopsies were taken with a cold forceps       for histology. Area was tattooed with an injection of 4 mL of Spot       (carbon black) just distal to the polyp in 4 quadrants. Location at 17       cm. Would not lift with saline.      A 5 mm polyp was found in the hepatic flexure. The polyp was sessile.       The polyp was removed with a cold snare. Resection and retrieval were       complete.      A 12 mm polyp was found in the proximal ascending colon. The polyp was       flat. The polyp was removed with a saline injection-lift technique using       a hot snare. Resection and retrieval were complete. PIECEMEAL FASHION.       Located several folds behind IC valve. Best viewed in retroflexion.      A 5 mm polyp was found in the proximal ascending colon. The polyp was       sessile. The polyp was removed with a cold snare. Resection and       retrieval were complete.      The exam was otherwise without abnormality on direct and retroflexion       views. Impression:           - Rule out malignancy, tumor in the recto-sigmoid                        colon. 2 cm in size. Would not lift wtih saline.                        Biopsied. Tattooed just distal to the polyp in 4                        quadrants. Location at 17 cm.                       - One 5 mm polyp at the hepatic flexure, removed with a                        cold snare. Resected and retrieved.                       - One 12 mm polyp in the proximal ascending colon,                        removed using injection-lift and a hot snare. Resected                        and retrieved. PIECEMEAL FASHION. Located several folds                        behind IC valve. Best viewed in retroflexion.                       -  One 5 mm polyp in the proximal ascending colon,                        removed with a cold snare. Resected  and retrieved.                       - The examination was otherwise normal on direct and                        retroflexion views. Recommendation:       - Observe patient in GI recovery unit.                       - High fiber diet.                       - Continue present medications.                       - Await pathology results.                       - Refer to a SURGEON at appointment to be scheduled for                        20 mm mass located 17 cm from dentate line.                       - Repeat colonoscopy in 1 year for surveillance.                       - Return to referring physician.                       - The findings and recommendations were discussed with                        the patient.                       - The findings and recommendations were discussed with                        the patient's family. Procedure Code(s):    --- Professional ---                       [phone], Colonoscopy, flexible; with removal of tumor(s),                        polyp(s), or other lesion(s) by snare technique                       [id number], Colonoscopy, flexible; with directed submucosal                        injection(s), any substance                       [id number], 59, Colonoscopy, flexible; with biopsy, single                        or multiple Diagnosis Code(s):    ---  Professional ---                       D49.0, Neoplasm of unspecified behavior of digestive                        system                       D12.3, Benign neoplasm of transverse colon (hepatic                        flexure or splenic flexure)                       D12.2, Benign neoplasm of ascending colon                       R19.5, Other fecal abnormalities CPT copyright 2016 American Medical Association. All rights reserved. The codes documented in this report are preliminary and upon coder review may  be revised to meet current compliance requirements. Mellody Life, MD 07/21/2015 3:26:12 PM This  report has been signed electronically. Number of Addenda: 0 Note Initiated On: 07/21/2015 2:07 PM Scope Withdrawal Time: 0 hours 46 minutes 30 seconds  Total Procedure Duration: 0 hours 56 minutes 7 seconds       [facility]

## 2015-07-21 NOTE — Anesthesia Preprocedure Evaluation (Addendum)
Anesthesia Evaluation  Patient identified by MRN, date of birth, ID band Patient awake    Reviewed: Allergy & Precautions, H&P , NPO status , Patient's Chart, lab work & pertinent test results  History of Anesthesia Complications Negative for: history of anesthetic complications  Airway Mallampati: III  TM Distance: >3 FB Neck ROM: limited    Dental  (+) Poor Dentition, Chipped   Pulmonary neg shortness of breath, former smoker,    Pulmonary exam normal breath sounds clear to auscultation       Cardiovascular Exercise Tolerance: Good (-) angina+CHF  (-) Past MI and (-) DOE Normal cardiovascular exam+ Cardiac Defibrillator  Rhythm:regular Rate:Normal     Neuro/Psych  Headaches, negative psych ROS   GI/Hepatic negative GI ROS, Neg liver ROS, neg GERD  ,  Endo/Other  negative endocrine ROS  Renal/GU negative Renal ROS  negative genitourinary   Musculoskeletal  (+) Arthritis ,   Abdominal   Peds  Hematology negative hematology ROS (+)   Anesthesia Other Findings Past Medical History:   Arthritis                                                    CHF (congestive heart failure) (HCC)                         Allergic state                                               Headache                                                     Hypercholesteremia                                           H/O endoscopic sinus surgery                                Past Surgical History:   HERNIA REPAIR                                                 KNEE ARTHROSCOPY                                Left 1970         defibrillatot placement                          05/12/2014    HAND TENDON SURGERY                             Left  2006        BMI    Body Mass Index   30.44 kg/m 2      Reproductive/Obstetrics negative OB ROS                             Anesthesia Physical Anesthesia Plan  ASA:  IV  Anesthesia Plan: General   Post-op Pain Management:    Induction:   Airway Management Planned:   Additional Equipment:   Intra-op Plan:   Post-operative Plan:   Informed Consent: I have reviewed the patients History and Physical, chart, labs and discussed the procedure including the risks, benefits and alternatives for the proposed anesthesia with the patient or authorized representative who has indicated his/her understanding and acceptance.   Dental Advisory Given  Plan Discussed with: Anesthesiologist, CRNA and Surgeon  Anesthesia Plan Comments:         Anesthesia Quick Evaluation

## 2015-07-21 NOTE — Anesthesia Postprocedure Evaluation (Signed)
Anesthesia Post Note  Patient: Luke Hess  Procedure(s) Performed: Procedure(s) (LRB): COLONOSCOPY WITH PROPOFOL (N/A)  Patient location during evaluation: Endoscopy Anesthesia Type: General Level of consciousness: awake and alert Pain management: pain level controlled Vital Signs Assessment: post-procedure vital signs reviewed and stable Respiratory status: spontaneous breathing and respiratory function stable Cardiovascular status: stable Anesthetic complications: no    Last Vitals:  Filed Vitals:   07/21/15 1328 07/21/15 1515  BP: 102/70 112/58  Pulse: 78 63  Temp: 36.4 C 36.1 C  Resp: 18 23    Last Pain: There were no vitals filed for this visit.               KEPHART,WILLIAM K

## 2015-07-21 NOTE — H&P (Signed)
  Primary Care Physician:  Marcello Fennel, MD  Pre-Procedure History & Physical: HPI:  Luke Hess is a 65 y.o. male is here for an colonoscopy.   Past Medical History  Diagnosis Date  . Arthritis   . CHF (congestive heart failure) ([place])   . Allergic state   . Headache   . Hypercholesteremia   . H/O endoscopic sinus surgery     Past Surgical History  Procedure Laterality Date  . Hernia repair    . Knee arthroscopy Left 1970  . Defibrillatot placement  05/12/2014  . Hand tendon surgery Left 2006    Prior to Admission medications   Medication Sig Start Date End Date Taking? Authorizing Provider  cetirizine (ZYRTEC) 10 MG tablet Take 10 mg by mouth daily.   Yes Historical Provider, MD  diphenhydramine-acetaminophen (TYLENOL PM) 25-500 MG TABS tablet Take 1 tablet by mouth at bedtime as needed.   Yes Historical Provider, MD  fluticasone (FLONASE) 50 MCG/ACT nasal spray Place 2 sprays into both nostrils daily.   Yes Historical Provider, MD  metoprolol succinate (TOPROL-XL) 25 MG 24 hr tablet Take 75 mg by mouth daily.   Yes Historical Provider, MD  polyethylene glycol-electrolytes (NULYTELY/GOLYTELY) 420 g solution Take 4,000 mLs by mouth once.   Yes Historical Provider, MD    Allergies as of 07/05/2015  . (Not on File)    History reviewed. No pertinent family history.  Social History   Social History  . Marital Status: Married    Spouse Name: N/A  . Number of Children: N/A  . Years of Education: N/A   Occupational History  . Not on file.   Social History Main Topics  . Smoking status: Former Smoker -- 1.00 packs/day for 3 years    Types: Cigarettes    Quit date: 05/11/1974  . Smokeless tobacco: Never Used  . Alcohol Use: No  . Drug Use: No  . Sexual Activity: Not on file   Other Topics Concern  . Not on file   Social History Narrative     Physical Exam: BP 102/70 mmHg  Pulse 78  Temp(Src) 97.5 F (36.4 C) (Tympanic)  Resp 18  Ht 6\' 4"  (1.93 m)   Wt 113.399 kg (250 lb)  BMI 30.44 kg/m2  SpO2 95% General:   Alert,  pleasant and cooperative in NAD Head:  Normocephalic and atraumatic. Neck:  Supple; no masses or thyromegaly. Lungs:  Clear throughout to auscultation.    Heart:  Regular rate and rhythm. Abdomen:  Soft, nontender and nondistended. Normal bowel sounds, without guarding, and without rebound.   Neurologic:  Alert and  oriented x4;  grossly normal neurologically.  Impression/Plan: Luke Hess is here for an colonoscopy to be performed for + FOBT  Risks, benefits, limitations, and alternatives regarding  colonoscopy have been reviewed with the patient.  Questions have been answered.  All parties agreeable.   Josefine Class, MD  07/21/2015, 2:06 PM

## 2015-07-21 NOTE — Transfer of Care (Signed)
Immediate Anesthesia Transfer of Care Note  Patient: Luke Hess  Procedure(s) Performed: Procedure(s): COLONOSCOPY WITH PROPOFOL (N/A)  Patient Location: Endoscopy Unit  Anesthesia Type:General  Level of Consciousness: awake, alert , oriented and patient cooperative  Airway & Oxygen Therapy: Patient Spontanous Breathing and Patient connected to nasal cannula oxygen  Post-op Assessment: Report given to RN, Post -op Vital signs reviewed and stable and Patient moving all extremities X 4  Post vital signs: Reviewed and stable  Last Vitals:  Filed Vitals:   07/21/15 1328 07/21/15 1515  BP: 102/70 112/58  Pulse: 78 63  Temp: 36.4 C 36.1 C  Resp: 18 23    Complications: No apparent anesthesia complications

## 2015-07-24 ENCOUNTER — Encounter: Payer: Self-pay | Admitting: Gastroenterology

## 2015-07-24 LAB — SURGICAL PATHOLOGY

## 2015-07-25 ENCOUNTER — Other Ambulatory Visit: Payer: Self-pay | Admitting: Gastroenterology

## 2015-07-25 DIAGNOSIS — D369 Benign neoplasm, unspecified site: Secondary | ICD-10-CM

## 2015-07-26 ENCOUNTER — Ambulatory Visit
Admission: RE | Admit: 2015-07-26 | Discharge: 2015-07-26 | Disposition: A | Discharge status: Home / Self Care | Payer: BLUE CROSS/BLUE SHIELD | Source: Ambulatory Visit | Attending: Gastroenterology | Admitting: Gastroenterology

## 2015-07-26 DIAGNOSIS — D369 Benign neoplasm, unspecified site: Secondary | ICD-10-CM

## 2015-07-26 DIAGNOSIS — K6389 Other specified diseases of intestine: Secondary | ICD-10-CM | POA: Insufficient documentation

## 2015-07-26 DIAGNOSIS — C801 Malignant (primary) neoplasm, unspecified: Secondary | ICD-10-CM | POA: Diagnosis not present

## 2015-07-26 DIAGNOSIS — R911 Solitary pulmonary nodule: Secondary | ICD-10-CM | POA: Diagnosis not present

## 2015-07-26 DIAGNOSIS — K769 Liver disease, unspecified: Secondary | ICD-10-CM | POA: Diagnosis not present

## 2015-07-26 LAB — POCT I-STAT CREATININE: Creatinine, Ser: 1.1 mg/dL (ref 0.61–1.24)

## 2015-07-26 MED ORDER — IOPAMIDOL (ISOVUE-370) INJECTION 76%
100.0000 mL | Freq: Once | INTRAVENOUS | Status: AC | PRN
  Administered 2015-07-26: 100 mL via INTRAVENOUS

## 2018-01-07 IMAGING — CT CT ABD-PELV W/ CM
2 of 3 series · 14 of 31 positions shown, 17 images · IV contrast (APPLIED)
Comparison: CT chest 07/26/2012. Colonoscopy reported 07/21/2015 is
reviewed.

CLINICAL DATA: Recent colonoscopy with rectosigmoid mass positive
for adenocarcinoma.

EXAM:
CT CHEST, ABDOMEN, AND PELVIS WITH CONTRAST
TECHNIQUE: Multidetector CT imaging of the chest, abdomen and pelvis was
performed following the standard protocol during bolus
administration of intravenous contrast.
CONTRAST:  100 cc Isovue 370.

[Series 2: cap with 2 · axial · 0.85mm/px · z∈[-1094,-514]mm · 10 of 146 slices shown, 13 images]
[im 15/146  mediastinal]
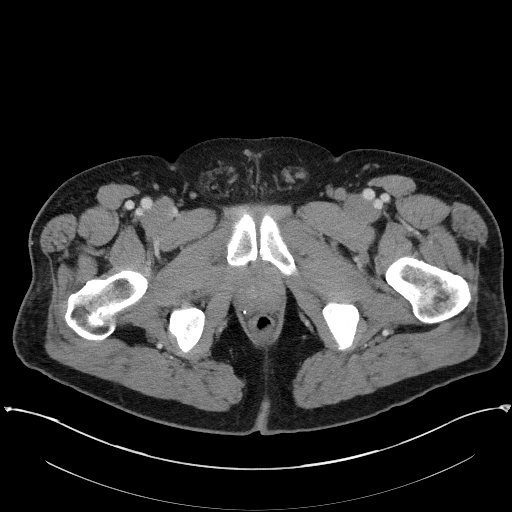
[im 15/146  lung]
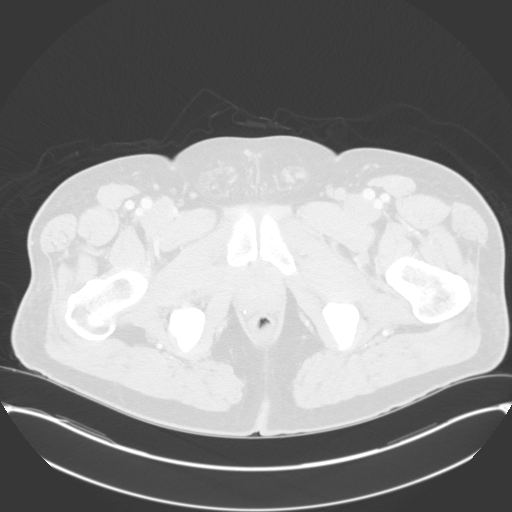
[im 30/146  lung]
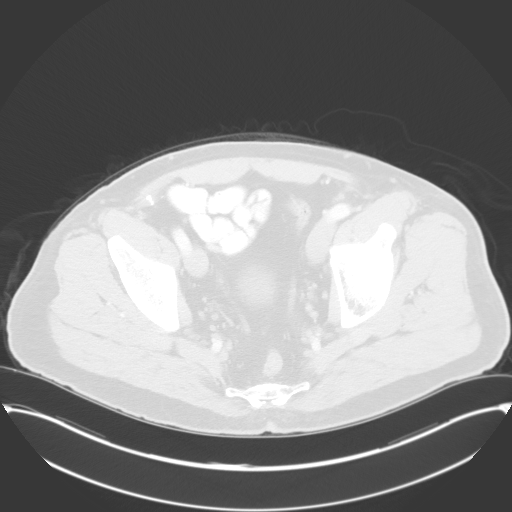
[im 44/146  lung]
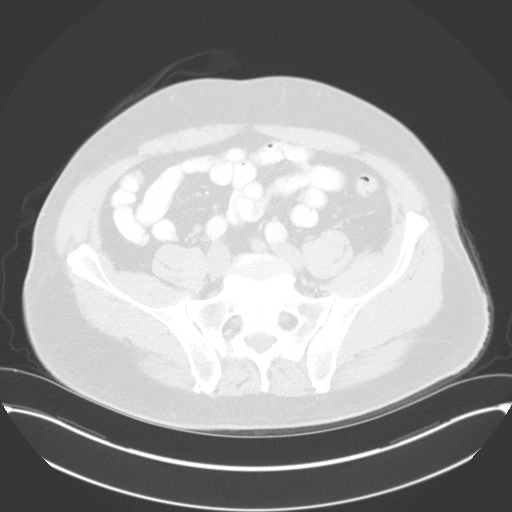
[im 59/146  lung]
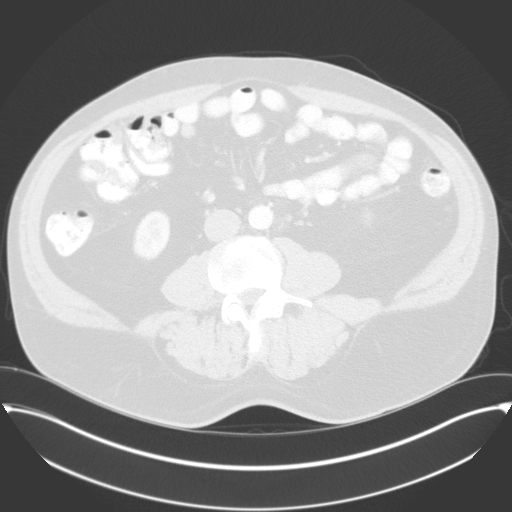
[im 72/146  mediastinal]
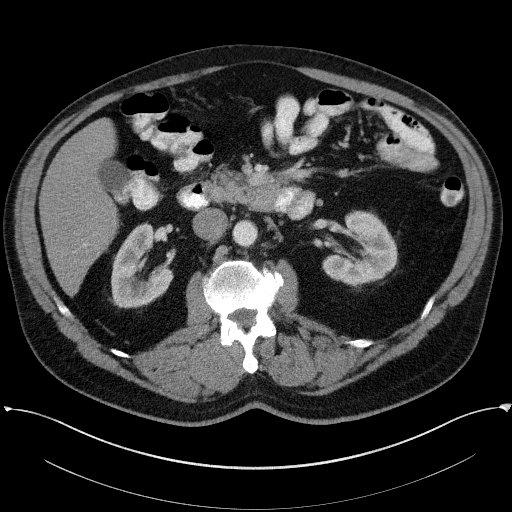
[im 72/146  lung]
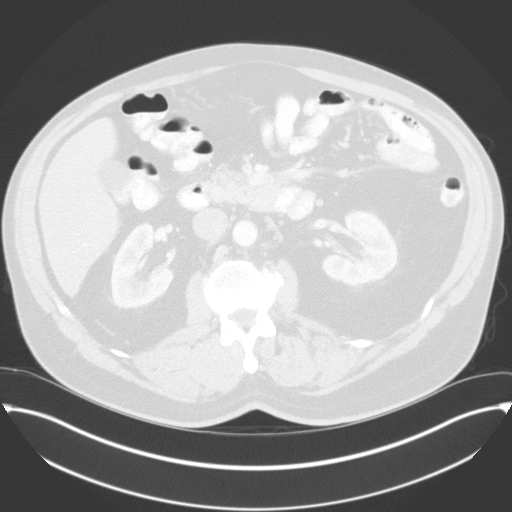
[im 73/146  lung]
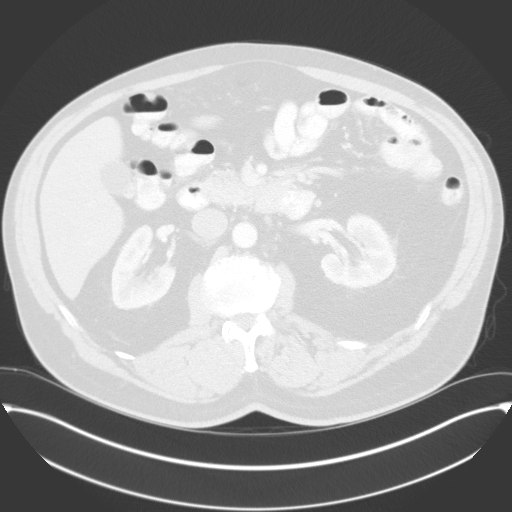
[im 88/146  lung]
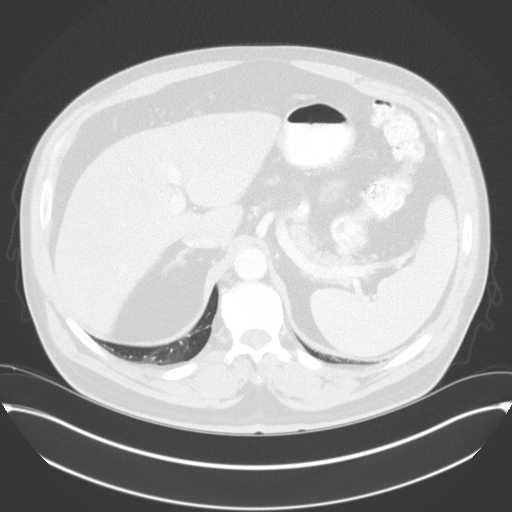
[im 102/146  lung]
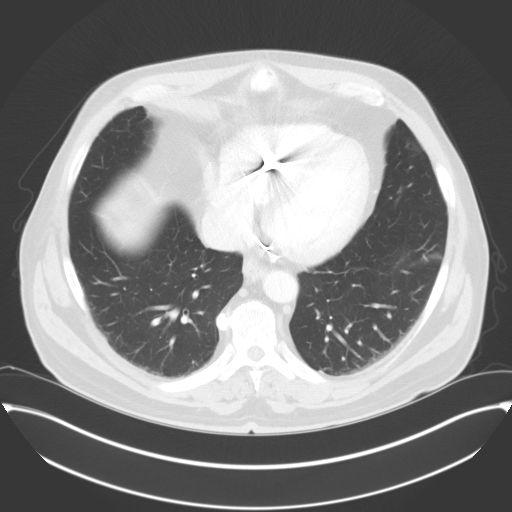
[im 117/146  mediastinal]
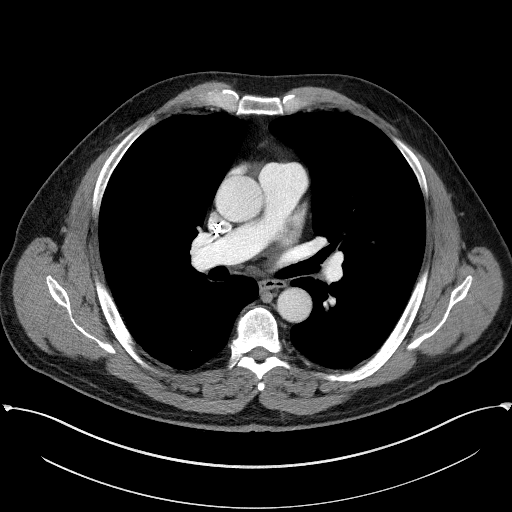
[im 117/146  lung]
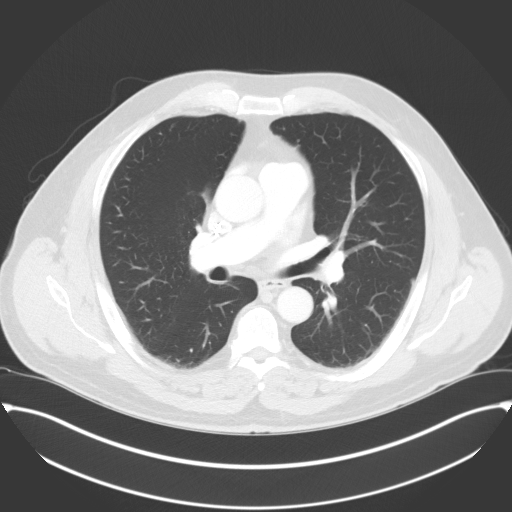
[im 131/146  lung]
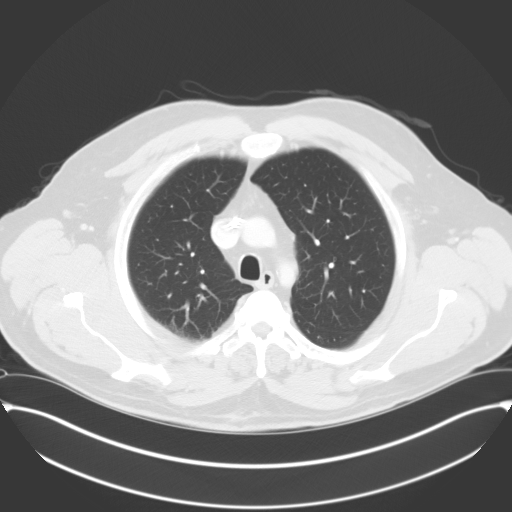

[Series 4: lung · axial · 0.85mm/px · z∈[-778,-638]mm · 4 of 185 slices shown]
[im 15/185  lung]
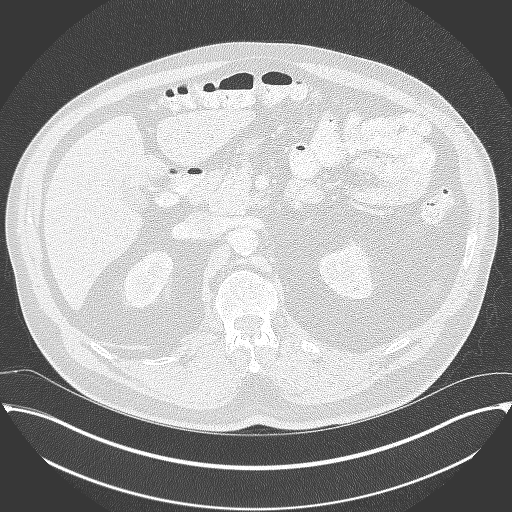
[im 43/185  lung]
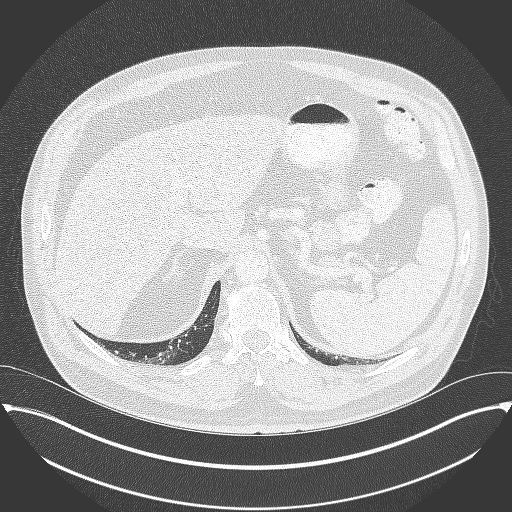
[im 71/185  lung]
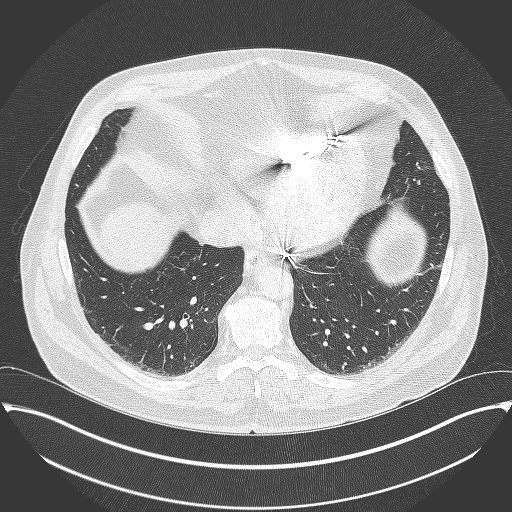
[im 85/185  lung]
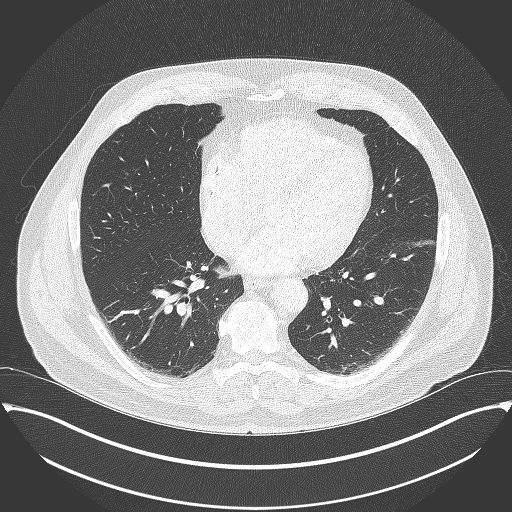

[14 of 31 positions shown; findings below may reference images not displayed]

FINDINGS: CT CHEST FINDINGS

Mediastinum/Lymph Nodes: No pathologically enlarged mediastinal,
hilar or axillary lymph nodes. Heart size normal. Coronary artery
calcification. No pericardial effusion.

Lungs/Pleura: A few tiny pulmonary nodules along the fissures are
unchanged, indicative of subpleural lymph nodes. Peripheral 3 mm
nodule in the right lower lobe (4/97) cannot be compared to the
prior exam due to collapse/ consolidation on that study. However, a
subpleural lymph node is still favored. Calcified granuloma in the
subpleural left upper lobe. Mild scattered basilar pulmonary
parenchymal scarring and volume loss. No pleural fluid. Airway is
unremarkable.

Musculoskeletal: No worrisome lytic or sclerotic lesions.
Degenerative changes are seen in the spine.

CT ABDOMEN PELVIS FINDINGS

Hepatobiliary: 7 mm low-attenuation lesion in segment IVb of the
liver (2/67), likely present on 07/26/2012. 4 mm peripheral segment
[place] and 8 mm segment II low-attenuation lesions are not well seen on
the prior exam. Liver and gallbladder are otherwise unremarkable. No
biliary ductal dilatation.

Pancreas: Negative.

Spleen: Negative.

Adrenals/Urinary Tract: Adrenal glands are unremarkable. 5 mm right
renal stone. Difficult to exclude punctate left renal stones. 10 mm
low-attenuation lesion in the interpolar right kidney is too small
to definitively characterize but statistically, a cyst is most
likely. Kidneys are otherwise unremarkable. Ureters are
decompressed. Bladder is grossly unremarkable.

Stomach/Bowel: Stomach, small bowel, appendix and majority of the
colon are unremarkable. Per colonoscopy report, a 2 cm lesion was
identified in the rectosigmoid colon, approximately 17 cm from the
anal verge. This corresponds to a focal area of polypoid thickening
in the rectosigmoid colon (series 2, image 113 and coronal series 5,
image 133).

Vascular/Lymphatic: Minimal atherosclerotic calcification of the
arterial vasculature. Mild haziness and sub cm nodularity in the
retroperitoneum. A 7 mm lymph node is seen in the sigmoid mesocolon
(2/112).

Reproductive: Prostate is normal in size.

Other: Right inguinal hernia repair. No free fluid. Mesenteries and
peritoneum are otherwise unremarkable.

Musculoskeletal: No worrisome lytic or sclerotic lesions.
Degenerative changes are seen in the spine.
IMPRESSION: 1. Rectosigmoid polypoid mass, corresponding to findings on recent
colonoscopy. 7 mm lymph node in the adjacent sigmoid mesocolon is
worrisome for a local nodal metastasis. No evidence of distant
metastatic disease.
2. Two sub cm low-attenuation liver lesions are not well seen on the
prior exam and are therefore indeterminate. Further evaluation could
be performed with MR abdomen without and with contrast, as
clinically indicated.
3. 3 mm medial right lower lobe nodule is of indeterminate stability
but subpleural position and subcarinal location are favorable for a
subpleural lymph node. Attention on followup exams is warranted.
4. Right renal stone. Difficult to exclude punctate left renal
stones.

## 2021-05-21 DIAGNOSIS — Z95810 Presence of automatic (implantable) cardiac defibrillator: Secondary | ICD-10-CM | POA: Diagnosis not present

## 2021-06-21 DIAGNOSIS — H2512 Age-related nuclear cataract, left eye: Secondary | ICD-10-CM | POA: Diagnosis not present

## 2021-06-21 DIAGNOSIS — H524 Presbyopia: Secondary | ICD-10-CM | POA: Diagnosis not present

## 2021-07-03 DIAGNOSIS — I428 Other cardiomyopathies: Secondary | ICD-10-CM | POA: Diagnosis not present

## 2021-07-04 ENCOUNTER — Telehealth: Payer: Self-pay

## 2021-07-04 NOTE — Telephone Encounter (Signed)
Patient's wife called stating patient's pcp is requesting new ss to be done. Sent secure chat to [place] ?

## 2021-07-19 DIAGNOSIS — Z9989 Dependence on other enabling machines and devices: Secondary | ICD-10-CM | POA: Diagnosis not present

## 2021-07-19 DIAGNOSIS — Z0000 Encounter for general adult medical examination without abnormal findings: Secondary | ICD-10-CM | POA: Diagnosis not present

## 2021-07-19 DIAGNOSIS — Z125 Encounter for screening for malignant neoplasm of prostate: Secondary | ICD-10-CM | POA: Diagnosis not present

## 2021-07-19 DIAGNOSIS — Z1331 Encounter for screening for depression: Secondary | ICD-10-CM | POA: Diagnosis not present

## 2021-07-19 DIAGNOSIS — G4733 Obstructive sleep apnea (adult) (pediatric): Secondary | ICD-10-CM | POA: Diagnosis not present

## 2021-07-19 DIAGNOSIS — Z79899 Other long term (current) drug therapy: Secondary | ICD-10-CM | POA: Diagnosis not present

## 2021-08-06 DIAGNOSIS — H2511 Age-related nuclear cataract, right eye: Secondary | ICD-10-CM | POA: Diagnosis not present

## 2021-08-09 ENCOUNTER — Encounter: Payer: Self-pay | Admitting: Ophthalmology

## 2021-08-09 ENCOUNTER — Other Ambulatory Visit: Payer: Self-pay

## 2021-08-13 DIAGNOSIS — G4733 Obstructive sleep apnea (adult) (pediatric): Secondary | ICD-10-CM | POA: Diagnosis not present

## 2021-08-16 NOTE — Discharge Instructions (Signed)

## 2021-08-20 ENCOUNTER — Encounter: Payer: Self-pay | Admitting: Ophthalmology

## 2021-08-20 ENCOUNTER — Ambulatory Visit: Payer: Medicare HMO | Admitting: Anesthesiology

## 2021-08-20 ENCOUNTER — Encounter
Admission: RE | Disposition: A | Discharge status: Home / Self Care | Payer: Self-pay | Source: Ambulatory Visit | Attending: Ophthalmology

## 2021-08-20 ENCOUNTER — Ambulatory Visit
Admission: RE | Admit: 2021-08-20 | Discharge: 2021-08-20 | Disposition: A | Discharge status: Home / Self Care | Payer: Medicare HMO | Source: Ambulatory Visit | Attending: Ophthalmology | Admitting: Ophthalmology

## 2021-08-20 ENCOUNTER — Other Ambulatory Visit: Payer: Self-pay

## 2021-08-20 DIAGNOSIS — G4730 Sleep apnea, unspecified: Secondary | ICD-10-CM | POA: Insufficient documentation

## 2021-08-20 DIAGNOSIS — I509 Heart failure, unspecified: Secondary | ICD-10-CM | POA: Insufficient documentation

## 2021-08-20 DIAGNOSIS — Z6832 Body mass index (BMI) 32.0-32.9, adult: Secondary | ICD-10-CM | POA: Insufficient documentation

## 2021-08-20 DIAGNOSIS — H25811 Combined forms of age-related cataract, right eye: Secondary | ICD-10-CM | POA: Diagnosis not present

## 2021-08-20 DIAGNOSIS — E669 Obesity, unspecified: Secondary | ICD-10-CM | POA: Diagnosis not present

## 2021-08-20 DIAGNOSIS — M1990 Unspecified osteoarthritis, unspecified site: Secondary | ICD-10-CM | POA: Insufficient documentation

## 2021-08-20 DIAGNOSIS — H2511 Age-related nuclear cataract, right eye: Secondary | ICD-10-CM | POA: Insufficient documentation

## 2021-08-20 DIAGNOSIS — Z95810 Presence of automatic (implantable) cardiac defibrillator: Secondary | ICD-10-CM | POA: Diagnosis not present

## 2021-08-20 DIAGNOSIS — Z87891 Personal history of nicotine dependence: Secondary | ICD-10-CM | POA: Diagnosis not present

## 2021-08-20 HISTORY — DX: Sleep apnea, unspecified: G47.30

## 2021-08-20 HISTORY — DX: Presence of automatic (implantable) cardiac defibrillator: Z95.810

## 2021-08-20 HISTORY — PX: CATARACT EXTRACTION W/PHACO: SHX586

## 2021-08-20 SURGERY — PHACOEMULSIFICATION, CATARACT, WITH IOL INSERTION
Anesthesia: Monitor Anesthesia Care | Site: Eye | Laterality: Right

## 2021-08-20 MED ORDER — SIGHTPATH DOSE#1 SODIUM HYALURONATE 23 MG/ML IO SOLUTION
PREFILLED_SYRINGE | INTRAOCULAR | Status: DC | PRN
Start: 2021-08-20 — End: 2021-08-20
  Administered 2021-08-20: 0.6 mL via INTRAOCULAR

## 2021-08-20 MED ORDER — ARMC OPHTHALMIC DILATING DROPS
1.0000 "application " | OPHTHALMIC | Status: DC | PRN
  Administered 2021-08-20 (×3): 1 via OPHTHALMIC

## 2021-08-20 MED ORDER — LIDOCAINE HCL (PF) 2 % IJ SOLN
INTRAOCULAR | Status: DC | PRN
  Administered 2021-08-20: 1 mL via INTRAOCULAR

## 2021-08-20 MED ORDER — SIGHTPATH DOSE#1 BSS IO SOLN
INTRAOCULAR | Status: DC | PRN
  Administered 2021-08-20: 65 mL via OPHTHALMIC

## 2021-08-20 MED ORDER — MIDAZOLAM HCL 2 MG/2ML IJ SOLN
INTRAMUSCULAR | Status: DC | PRN
Start: 2021-08-20 — End: 2021-08-20
  Administered 2021-08-20: 2 mg via INTRAVENOUS

## 2021-08-20 MED ORDER — FENTANYL CITRATE (PF) 100 MCG/2ML IJ SOLN
INTRAMUSCULAR | Status: DC | PRN
  Administered 2021-08-20 (×2): 50 ug via INTRAVENOUS

## 2021-08-20 MED ORDER — SIGHTPATH DOSE#1 SODIUM HYALURONATE 10 MG/ML IO SOLUTION
PREFILLED_SYRINGE | INTRAOCULAR | Status: DC | PRN
  Administered 2021-08-20: 0.85 mL via INTRAOCULAR

## 2021-08-20 MED ORDER — LACTATED RINGERS IV SOLN: INTRAVENOUS | Status: DC

## 2021-08-20 MED ORDER — MOXIFLOXACIN HCL 0.5 % OP SOLN
OPHTHALMIC | Status: DC | PRN
  Administered 2021-08-20: 0.2 mL via OPHTHALMIC

## 2021-08-20 MED ORDER — SIGHTPATH DOSE#1 BSS IO SOLN
INTRAOCULAR | Status: DC | PRN
  Administered 2021-08-20: 15 mL

## 2021-08-20 MED ORDER — TETRACAINE HCL 0.5 % OP SOLN
1.0000 [drp] | OPHTHALMIC | Status: DC | PRN
  Administered 2021-08-20 (×3): 1 [drp] via OPHTHALMIC

## 2021-08-20 SURGICAL SUPPLY — 14 items
CATARACT SUITE SIGHTPATH (MISCELLANEOUS) ×2 IMPLANT
DISSECTOR HYDRO NUCLEUS 50X22 (MISCELLANEOUS) ×2 IMPLANT
FEE CATARACT SUITE SIGHTPATH (MISCELLANEOUS) ×1 IMPLANT
GLOVE SURG GAMMEX PI TX LF 7.5 (GLOVE) ×2 IMPLANT
GLOVE SURG SYN 8.5  E (GLOVE) ×1
GLOVE SURG SYN 8.5 E (GLOVE) ×1 IMPLANT
GLOVE SURG SYN 8.5 PF PI (GLOVE) ×1 IMPLANT
LENS IOL TECNIS EYHANCE 18.0 (Intraocular Lens) ×1 IMPLANT
NDL FILTER BLUNT 18X1 1/2 (NEEDLE) ×1 IMPLANT
NEEDLE FILTER BLUNT 18X 1/2SAF (NEEDLE) ×1
NEEDLE FILTER BLUNT 18X1 1/2 (NEEDLE) ×1 IMPLANT
SYR 3ML LL SCALE MARK (SYRINGE) ×2 IMPLANT
SYR 5ML LL (SYRINGE) ×2 IMPLANT
WATER STERILE IRR 250ML POUR (IV SOLUTION) ×2 IMPLANT

## 2021-08-20 NOTE — H&P (Signed)
[place]  ? ?Primary Care Physician:  Derinda Late, MD ?Ophthalmologist: Dr. Benay Pillow ? ?Pre-Procedure History & Physical: ?HPI:  Luke Hess is a 71 y.o. male here for cataract surgery. ?  ?Past Medical History:  ?Diagnosis Date  ? AICD (automatic cardioverter/defibrillator) present   ? Dr Derinda Sis, cardiologist  ? Allergic state   ? Arthritis   ? arms, hands  ? CHF (congestive heart failure) ([place])   ? H/O endoscopic sinus surgery   ? Headache   ? none in last 2 years  ? Hypercholesteremia   ? Sleep apnea   ? Uses CPAP  ? ? ?Past Surgical History:  ?Procedure Laterality Date  ? COLONOSCOPY WITH PROPOFOL N/A 07/21/2015  ? Procedure: COLONOSCOPY WITH PROPOFOL;  Surgeon: Josefine Class, MD;  Location: [facility] ENDOSCOPY;  Service: Endoscopy;  Laterality: N/A;  ? defibrillatot placement  05/12/2014  ? HAND TENDON SURGERY Left 2006  ? HERNIA REPAIR    ? KNEE ARTHROSCOPY Left 1970  ? ? ?Prior to Admission medications   ?Medication Sig Start Date End Date Taking? Authorizing Provider  ?cetirizine (ZYRTEC) 10 MG tablet Take 10 mg by mouth daily.   Yes [provider]  ?diphenhydramine-acetaminophen (TYLENOL PM) 25-500 MG TABS tablet Take 1 tablet by mouth at bedtime as needed.   Yes [provider]  ?meloxicam (MOBIC) 15 MG tablet Take 15 mg by mouth daily.   Yes [provider]  ?metoprolol succinate (TOPROL-XL) 25 MG 24 hr tablet Take 50 mg by mouth in the morning and at bedtime.   Yes [provider]  ?polyethylene glycol-electrolytes (NULYTELY/GOLYTELY) 420 g solution Take 4,000 mLs by mouth once.   Yes [provider]  ?sacubitril-valsartan (ENTRESTO) 24-26 MG Take 1 tablet by mouth 2 (two) times daily.   Yes [provider]  ?fluticasone (FLONASE) 50 MCG/ACT nasal spray Place 2 sprays into both nostrils daily. ?Patient not taking: Reported on 08/09/2021    [provider]  ? ? ?Allergies as of 06/26/2021  ? (No Known Allergies)   ? ? ?History reviewed. No pertinent family history. ? ?Social History  ? ?Socioeconomic History  ? Marital status: Married  ?  Spouse name: Not on file  ? Number of children: Not on file  ? Years of education: Not on file  ? Highest education level: Not on file  ?Occupational History  ? Not on file  ?Tobacco Use  ? Smoking status: Former  ?  Packs/day: 1.00  ?  Years: 3.00  ?  Pack years: 3.00  ?  Types: Cigarettes  ?  Quit date: 05/11/1974  ?  Years since quitting: 47.3  ? Smokeless tobacco: Never  ?Substance and Sexual Activity  ? Alcohol use: No  ?  Comment: seldom  ? Drug use: No  ? Sexual activity: Not on file  ?Other Topics Concern  ? Not on file  ?Social History Narrative  ? Not on file  ? ?Social Determinants of Health  ? ?Financial Resource Strain: Not on file  ?Food Insecurity: Not on file  ?Transportation Needs: Not on file  ?Physical Activity: Not on file  ?Stress: Not on file  ?Social Connections: Not on file  ?Intimate Partner Violence: Not on file  ? ? ?Review of Systems: ?See HPI, otherwise negative ROS ? ?Physical Exam: ?BP 122/83   Pulse 95   Temp 97.9 ?F (36.6 ?C) (Temporal)   Resp 20   Ht '6\' 4"'$  (1.93 m)   Wt 117.5 kg  SpO2 95%   BMI 31.53 kg/m?  ?General:   Alert, cooperative in NAD ?Head:  Normocephalic and atraumatic. ?Respiratory:  Normal work of breathing. ?Cardiovascular:  RRR ? ?Impression/Plan: ?Luke Hess is here for cataract surgery. ? ?Risks, benefits, limitations, and alternatives regarding cataract surgery have been reviewed with the patient.  Questions have been answered.  All parties agreeable. ? ? ?Benay Pillow, MD  08/20/2021, 9:35 AM ? ? ?

## 2021-08-20 NOTE — Transfer of Care (Signed)
Immediate Anesthesia Transfer of Care Note ? ?Patient: Luke Hess ? ?Procedure(s) Performed: CATARACT EXTRACTION PHACO AND INTRAOCULAR LENS PLACEMENT (IOC) RIGHT 2.96 00:24.5 (Right: Eye) ? ?Patient Location: PACU ? ?Anesthesia Type: MAC ? ?Level of Consciousness: awake, alert  and patient cooperative ? ?Airway and Oxygen Therapy: Patient Spontanous Breathing and Patient connected to supplemental oxygen ? ?Post-op Assessment: Post-op Vital signs reviewed, Patient's Cardiovascular Status Stable, Respiratory Function Stable, Patent Airway and No signs of Nausea or vomiting ? ?Post-op Vital Signs: Reviewed and stable ? ?Complications: No notable events documented. ? ?

## 2021-08-20 NOTE — Anesthesia Preprocedure Evaluation (Signed)
Anesthesia Evaluation  ?Patient identified by MRN, date of birth, ID band ?Patient awake ? ? ? ?Reviewed: ?Allergy & Precautions, NPO status , Patient's Chart, lab work & pertinent test results, reviewed documented beta blocker date and time  ? ?History of Anesthesia Complications ?Negative for: history of anesthetic complications ? ?Airway ?Mallampati: III ? ?TM Distance: >3 FB ?Neck ROM: Limited ? ? ? Dental ?  ?Pulmonary ?sleep apnea , former smoker,  ?  ?breath sounds clear to auscultation ? ? ? ? ? ? Cardiovascular ?Exercise Tolerance: Poor ?(-) angina+CHF and + DOE (Chronic, stable)  ?+ Cardiac Defibrillator ? ?Rhythm:Regular Rate:Normal ? ? ?HLD ? ?TTE (06/2021): ?LVEF 40%, mild LVH ?Trivial AI, trivial MR, trivial PI, trivial TR ?Bi-atrial enlargement ?Ascending aorta mildly dilated (4 cm) ? ?AICD Interrogation (04/2021): ?Stable CRT-D lead function. 5 mos battery remaining.  ?97.5% total VP  ?No interval events. No therapies delivered.  ?VS episodes consistent with ST/AT. ?  ?Neuro/Psych ? Headaches,   ? GI/Hepatic ?neg GERD  ,  ?Endo/Other  ? ? Renal/GU ?  ? ?  ?Musculoskeletal ? ?(+) Arthritis ,  ? Abdominal ?(+) + obese (BMI 32),   ?Peds ? Hematology ?  ?Anesthesia Other Findings ? ? Reproductive/Obstetrics ? ?  ? ? ? ? ? ? ? ? ? ? ? ? ? ?  ?  ? ? ? ? ? ? ? ? ?Anesthesia Physical ?Anesthesia Plan ? ?ASA: 3 ? ?Anesthesia Plan: MAC  ? ?Post-op Pain Management:   ? ?Induction: Intravenous ? ?PONV Risk Score and Plan: 1 and TIVA, Midazolam and Treatment may vary due to age or medical condition ? ?Airway Management Planned: Nasal Cannula ? ?Additional Equipment:  ? ?Intra-op Plan:  ? ?Post-operative Plan:  ? ?Informed Consent: I have reviewed the patients History and Physical, chart, labs and discussed the procedure including the risks, benefits and alternatives for the proposed anesthesia with the patient or authorized representative who has indicated his/her understanding and  acceptance.  ? ? ? ? ? ?Plan Discussed with: CRNA and Anesthesiologist ? ?Anesthesia Plan Comments:   ? ? ? ? ? ? ?Anesthesia Quick Evaluation ? ?

## 2021-08-20 NOTE — Anesthesia Postprocedure Evaluation (Signed)
Anesthesia Post Note ? ?Patient: Luke Hess ? ?Procedure(s) Performed: CATARACT EXTRACTION PHACO AND INTRAOCULAR LENS PLACEMENT (IOC) RIGHT 2.96 00:24.5 (Right: Eye) ? ? ?  ?Patient location during evaluation: PACU ?Anesthesia Type: MAC ?Level of consciousness: awake and alert ?Pain management: pain level controlled ?Vital Signs Assessment: post-procedure vital signs reviewed and stable ?Respiratory status: spontaneous breathing, nonlabored ventilation, respiratory function stable and patient connected to nasal cannula oxygen ?Cardiovascular status: stable and blood pressure returned to baseline ?Postop Assessment: no apparent nausea or vomiting ?Anesthetic complications: no ? ? ?No notable events documented. ? ?Birtie Fellman A  Devanny Palecek ? ? ? ? ? ?

## 2021-08-20 NOTE — Anesthesia Procedure Notes (Signed)
Procedure Name: [place] ?Date/Time: 08/20/2021 9:42 AM ?Performed by: Jeannene Patella, CRNA ?Pre-anesthesia Checklist: Patient identified, Emergency Drugs available, Suction available, Timeout performed and Patient being monitored ?Patient Re-evaluated:Patient Re-evaluated prior to induction ?Oxygen Delivery Method: Nasal cannula ?Placement Confirmation: positive ETCO2 ? ? ? ? ?

## 2021-08-20 NOTE — Op Note (Signed)
OPERATIVE NOTE ? [name] ?[id number] ?08/20/2021 ? ? ?PREOPERATIVE DIAGNOSIS:  Nuclear sclerotic cataract right eye.  H25.11 ?  ?POSTOPERATIVE DIAGNOSIS:    Nuclear sclerotic cataract right eye.   ?  ?PROCEDURE:  Phacoemusification with posterior chamber intraocular lens placement of the right eye  ? ?LENS:   ?Implant Name Type Inv. Item Serial No. Manufacturer Lot No. LRB No. Used Action  ?LENS IOL TECNIS EYHANCE 18.0 - [id number] Intraocular Lens LENS IOL TECNIS EYHANCE 18.0 [id number] [employer]  Right 1 Implanted  ?    ? ?Procedure(s): ?CATARACT EXTRACTION PHACO AND INTRAOCULAR LENS PLACEMENT (IOC) RIGHT 2.96 00:24.5 (Right) ? ?DIB00 +18.0 ?  ?ULTRASOUND TIME: 0 minutes 24 seconds.  CDE 2.96 ?  ?SURGEON:  Benay Pillow, MD, MPH ? ?ANESTHESIOLOGIST: Anesthesiologist: Heniser, Fredric Dine, MD ?CRNA: Jeannene Patella, CRNA ?  ?ANESTHESIA:  Topical with tetracaine drops augmented with 1% preservative-free intracameral lidocaine. ? ?ESTIMATED BLOOD LOSS: less than 1 mL. ?  ?COMPLICATIONS:  None. ?  ?DESCRIPTION OF PROCEDURE:  The patient was identified in the holding room and transported to the operating room and placed in the supine position under the operating microscope.  The right eye was identified as the operative eye and it was prepped and draped in the usual sterile ophthalmic fashion. ?  ?A 1.0 millimeter clear-corneal paracentesis was made at the 10:30 position. 0.5 ml of preservative-free 1% lidocaine with epinephrine was injected into the anterior chamber. ? The anterior chamber was filled with Healon 5 viscoelastic.  A 2.4 millimeter keratome was used to make a near-clear corneal incision at the 8:00 position.  A curvilinear capsulorrhexis was made with a cystotome and capsulorrhexis forceps.  Balanced salt solution was used to hydrodissect and hydrodelineate the nucleus. ?  ?Phacoemulsification was then used in stop and chop fashion to remove the lens nucleus and epinucleus.  The remaining cortex  was then removed using the irrigation and aspiration handpiece. Healon was then placed into the capsular bag to distend it for lens placement.  A lens was then injected into the capsular bag.  The remaining viscoelastic was aspirated. ?  ?Wounds were hydrated with balanced salt solution.  The anterior chamber was inflated to a physiologic pressure with balanced salt solution.  ? ?Intracameral vigamox 0.1 mL undiluted was injected into the eye and a drop placed onto the ocular surface. ? ?No wound leaks were noted.  The patient was taken to the recovery room in stable condition without complications of anesthesia or surgery ? ?Benay Pillow ?08/20/2021, 9:58 AM ? ?

## 2021-08-21 ENCOUNTER — Encounter: Payer: Self-pay | Admitting: Ophthalmology

## 2021-08-21 DIAGNOSIS — Z95810 Presence of automatic (implantable) cardiac defibrillator: Secondary | ICD-10-CM | POA: Diagnosis not present

## 2021-08-27 ENCOUNTER — Encounter: Payer: Self-pay | Admitting: Ophthalmology

## 2021-08-27 DIAGNOSIS — Z95810 Presence of automatic (implantable) cardiac defibrillator: Secondary | ICD-10-CM | POA: Diagnosis not present

## 2021-08-28 DIAGNOSIS — H2512 Age-related nuclear cataract, left eye: Secondary | ICD-10-CM | POA: Diagnosis not present

## 2021-08-30 NOTE — Discharge Instructions (Signed)

## 2021-09-03 ENCOUNTER — Ambulatory Visit: Payer: Medicare HMO | Admitting: Anesthesiology

## 2021-09-03 ENCOUNTER — Encounter: Payer: Self-pay | Admitting: Ophthalmology

## 2021-09-03 ENCOUNTER — Encounter
Admission: RE | Disposition: A | Discharge status: Home / Self Care | Payer: Self-pay | Source: Ambulatory Visit | Attending: Ophthalmology

## 2021-09-03 ENCOUNTER — Ambulatory Visit
Admission: RE | Admit: 2021-09-03 | Discharge: 2021-09-03 | Disposition: A | Discharge status: Home / Self Care | Payer: Medicare HMO | Source: Ambulatory Visit | Attending: Ophthalmology | Admitting: Ophthalmology

## 2021-09-03 ENCOUNTER — Other Ambulatory Visit: Payer: Self-pay

## 2021-09-03 DIAGNOSIS — Z01818 Encounter for other preprocedural examination: Secondary | ICD-10-CM

## 2021-09-03 DIAGNOSIS — Z87891 Personal history of nicotine dependence: Secondary | ICD-10-CM | POA: Diagnosis not present

## 2021-09-03 DIAGNOSIS — Z95810 Presence of automatic (implantable) cardiac defibrillator: Secondary | ICD-10-CM | POA: Insufficient documentation

## 2021-09-03 DIAGNOSIS — Z6832 Body mass index (BMI) 32.0-32.9, adult: Secondary | ICD-10-CM | POA: Diagnosis not present

## 2021-09-03 DIAGNOSIS — H2512 Age-related nuclear cataract, left eye: Secondary | ICD-10-CM | POA: Insufficient documentation

## 2021-09-03 DIAGNOSIS — I509 Heart failure, unspecified: Secondary | ICD-10-CM | POA: Diagnosis not present

## 2021-09-03 DIAGNOSIS — E669 Obesity, unspecified: Secondary | ICD-10-CM | POA: Diagnosis not present

## 2021-09-03 DIAGNOSIS — H25812 Combined forms of age-related cataract, left eye: Secondary | ICD-10-CM | POA: Diagnosis not present

## 2021-09-03 HISTORY — PX: CATARACT EXTRACTION W/PHACO: SHX586

## 2021-09-03 SURGERY — PHACOEMULSIFICATION, CATARACT, WITH IOL INSERTION
Anesthesia: Monitor Anesthesia Care | Site: Eye | Laterality: Left

## 2021-09-03 MED ORDER — ONDANSETRON HCL 4 MG/2ML IJ SOLN: 4.0000 mg | Freq: Once | INTRAMUSCULAR | Status: DC | PRN

## 2021-09-03 MED ORDER — SIGHTPATH DOSE#1 BSS IO SOLN
INTRAOCULAR | Status: DC | PRN
  Administered 2021-09-03: 15 mL

## 2021-09-03 MED ORDER — ACETAMINOPHEN 325 MG PO TABS: 650.0000 mg | ORAL_TABLET | ORAL | Status: DC | PRN

## 2021-09-03 MED ORDER — TETRACAINE HCL 0.5 % OP SOLN
1.0000 [drp] | OPHTHALMIC | Status: DC | PRN
  Administered 2021-09-03 (×3): 1 [drp] via OPHTHALMIC

## 2021-09-03 MED ORDER — SIGHTPATH DOSE#1 SODIUM HYALURONATE 23 MG/ML IO SOLUTION
PREFILLED_SYRINGE | INTRAOCULAR | Status: DC | PRN
  Administered 2021-09-03: 0.6 mL via INTRAOCULAR

## 2021-09-03 MED ORDER — FENTANYL CITRATE (PF) 100 MCG/2ML IJ SOLN
INTRAMUSCULAR | Status: DC | PRN
  Administered 2021-09-03: 50 ug via INTRAVENOUS

## 2021-09-03 MED ORDER — SIGHTPATH DOSE#1 BSS IO SOLN
INTRAOCULAR | Status: DC | PRN
  Administered 2021-09-03: 64 mL via OPHTHALMIC

## 2021-09-03 MED ORDER — LIDOCAINE HCL (PF) 2 % IJ SOLN
INTRAOCULAR | Status: DC | PRN
  Administered 2021-09-03: 1 mL via INTRAOCULAR

## 2021-09-03 MED ORDER — LACTATED RINGERS IV SOLN: INTRAVENOUS | Status: DC

## 2021-09-03 MED ORDER — CYCLOPENTOLATE HCL 2 % OP SOLN
1.0000 [drp] | OPHTHALMIC | Status: AC | PRN
Start: 2021-09-03 — End: 2021-09-03
  Administered 2021-09-03 (×3): 1 [drp] via OPHTHALMIC

## 2021-09-03 MED ORDER — ACETAMINOPHEN 160 MG/5ML PO SOLN: 325.0000 mg | ORAL | Status: DC | PRN

## 2021-09-03 MED ORDER — MOXIFLOXACIN HCL 0.5 % OP SOLN
OPHTHALMIC | Status: DC | PRN
  Administered 2021-09-03: 0.2 mL via OPHTHALMIC

## 2021-09-03 MED ORDER — PHENYLEPHRINE HCL 10 % OP SOLN
1.0000 [drp] | OPHTHALMIC | Status: AC | PRN
  Administered 2021-09-03 (×3): 1 [drp] via OPHTHALMIC

## 2021-09-03 MED ORDER — MIDAZOLAM HCL 2 MG/2ML IJ SOLN
INTRAMUSCULAR | Status: DC | PRN
Start: 2021-09-03 — End: 2021-09-03
  Administered 2021-09-03 (×2): 1 mg via INTRAVENOUS

## 2021-09-03 MED ORDER — SIGHTPATH DOSE#1 SODIUM HYALURONATE 10 MG/ML IO SOLUTION
PREFILLED_SYRINGE | INTRAOCULAR | Status: DC | PRN
  Administered 2021-09-03: 0.85 mL via INTRAOCULAR

## 2021-09-03 SURGICAL SUPPLY — 14 items
CATARACT SUITE SIGHTPATH (MISCELLANEOUS) ×2 IMPLANT
DISSECTOR HYDRO NUCLEUS 50X22 (MISCELLANEOUS) ×2 IMPLANT
FEE CATARACT SUITE SIGHTPATH (MISCELLANEOUS) ×1 IMPLANT
GLOVE SURG GAMMEX PI TX LF 7.5 (GLOVE) ×2 IMPLANT
GLOVE SURG SYN 8.5  E (GLOVE) ×1
GLOVE SURG SYN 8.5 E (GLOVE) ×1 IMPLANT
GLOVE SURG SYN 8.5 PF PI (GLOVE) ×1 IMPLANT
LENS IOL TECNIS EYHANCE 18.5 (Intraocular Lens) ×1 IMPLANT
NDL FILTER BLUNT 18X1 1/2 (NEEDLE) ×1 IMPLANT
NEEDLE FILTER BLUNT 18X 1/2SAF (NEEDLE) ×1
NEEDLE FILTER BLUNT 18X1 1/2 (NEEDLE) ×1 IMPLANT
SYR 3ML LL SCALE MARK (SYRINGE) ×2 IMPLANT
SYR 5ML LL (SYRINGE) ×2 IMPLANT
WATER STERILE IRR 250ML POUR (IV SOLUTION) ×2 IMPLANT

## 2021-09-03 NOTE — Transfer of Care (Signed)
Immediate Anesthesia Transfer of Care Note ? ?Patient: Luke Hess ? ?Procedure(s) Performed: CATARACT EXTRACTION PHACO AND INTRAOCULAR LENS PLACEMENT (IOC) LEFT 3.22 00:26.0 (Left: Eye) ? ?Patient Location: PACU ? ?Anesthesia Type: MAC ? ?Level of Consciousness: awake, alert  and patient cooperative ? ?Airway and Oxygen Therapy: Patient Spontanous Breathing and Patient connected to supplemental oxygen ? ?Post-op Assessment: Post-op Vital signs reviewed, Patient's Cardiovascular Status Stable, Respiratory Function Stable, Patent Airway and No signs of Nausea or vomiting ? ?Post-op Vital Signs: Reviewed and stable ? ?Complications: No notable events documented. ? ?

## 2021-09-03 NOTE — Op Note (Signed)
OPERATIVE NOTE ? ?FAHED MORTEN ?[id number] ?09/03/2021 ? ? ?PREOPERATIVE DIAGNOSIS:  Nuclear sclerotic cataract left eye.  H25.12 ?  ?POSTOPERATIVE DIAGNOSIS:    Nuclear sclerotic cataract left eye.   ?  ?PROCEDURE:  Phacoemusification with posterior chamber intraocular lens placement of the left eye  ? ?LENS:   ?Implant Name Type Inv. Item Serial No. Manufacturer Lot No. LRB No. Used Action  ?LENS IOL TECNIS EYHANCE 18.5 - [id number] Intraocular Lens LENS IOL TECNIS EYHANCE 18.5 [id number] [employer]  Left 1 Implanted  ?    ?Procedure(s) with comments: ?CATARACT EXTRACTION PHACO AND INTRAOCULAR LENS PLACEMENT (IOC) LEFT 3.22 00:26.0 (Left) - sleep apnea ? ?DIB00 +18.5 ?  ?ULTRASOUND TIME: 0 minutes 26 seconds.  CDE 3.22 ?  ?SURGEON:  Benay Pillow, MD, MPH ?  ?ANESTHESIA:  Topical with tetracaine drops augmented with 1% preservative-free intracameral lidocaine. ? ?ESTIMATED BLOOD LOSS: <1 mL ?  ?COMPLICATIONS:  None. ?  ?DESCRIPTION OF PROCEDURE:  The patient was identified in the holding room and transported to the operating room and placed in the supine position under the operating microscope.  The left eye was identified as the operative eye and it was prepped and draped in the usual sterile ophthalmic fashion. ?  ?A 1.0 millimeter clear-corneal paracentesis was made at the 5:00 position. 0.5 ml of preservative-free 1% lidocaine with epinephrine was injected into the anterior chamber. ? The anterior chamber was filled with Healon 5 viscoelastic.  A 2.4 millimeter keratome was used to make a near-clear corneal incision at the 2:00 position.  A curvilinear capsulorrhexis was made with a cystotome and capsulorrhexis forceps.  Balanced salt solution was used to hydrodissect and hydrodelineate the nucleus. ?  ?Phacoemulsification was then used in stop and chop fashion to remove the lens nucleus and epinucleus.  The remaining cortex was then removed using the irrigation and aspiration handpiece. Healon was then placed  into the capsular bag to distend it for lens placement.  A lens was then injected into the capsular bag.  The remaining viscoelastic was aspirated. ?  ?Wounds were hydrated with balanced salt solution.  The anterior chamber was inflated to a physiologic pressure with balanced salt solution. ? ?Intracameral vigamox 0.1 mL undiltued was injected into the eye and a drop placed onto the ocular surface. ? No wound leaks were noted.  The patient was taken to the recovery room in stable condition without complications of anesthesia or surgery ? ?Benay Pillow ?09/03/2021, 12:30 PM ? ?

## 2021-09-03 NOTE — H&P (Signed)
[place]  ? ?Primary Care Physician:  Derinda Late, MD ?Ophthalmologist: Dr. Benay Pillow ? ?Pre-Procedure History & Physical: ?HPI:  Luke Hess is a 71 y.o. male here for cataract surgery. ?  ?Past Medical History:  ?Diagnosis Date  ? AICD (automatic cardioverter/defibrillator) present   ? Dr Derinda Sis, cardiologist  ? Allergic state   ? Arthritis   ? arms, hands  ? CHF (congestive heart failure) ([place])   ? H/O endoscopic sinus surgery   ? Headache   ? none in last 2 years  ? Hypercholesteremia   ? Sleep apnea   ? Uses CPAP  ? ? ?Past Surgical History:  ?Procedure Laterality Date  ? CATARACT EXTRACTION W/PHACO Right 08/20/2021  ? Procedure: CATARACT EXTRACTION PHACO AND INTRAOCULAR LENS PLACEMENT (IOC) RIGHT 2.96 00:24.5;  Surgeon: Eulogio Bear, MD;  Location: [place];  Service: Ophthalmology;  Laterality: Right;  ? COLONOSCOPY WITH PROPOFOL N/A 07/21/2015  ? Procedure: COLONOSCOPY WITH PROPOFOL;  Surgeon: Josefine Class, MD;  Location: [facility] ENDOSCOPY;  Service: Endoscopy;  Laterality: N/A;  ? defibrillatot placement  05/12/2014  ? HAND TENDON SURGERY Left 2006  ? HERNIA REPAIR    ? KNEE ARTHROSCOPY Left 1970  ? ? ?Prior to Admission medications   ?Medication Sig Start Date End Date Taking? Authorizing Provider  ?diphenhydramine-acetaminophen (TYLENOL PM) 25-500 MG TABS tablet Take 1 tablet by mouth at bedtime as needed.   Yes [provider]  ?fluticasone (FLONASE) 50 MCG/ACT nasal spray Place 2 sprays into both nostrils daily.   Yes [provider]  ?meloxicam (MOBIC) 15 MG tablet Take 15 mg by mouth daily.   Yes [provider]  ?metoprolol succinate (TOPROL-XL) 25 MG 24 hr tablet Take 50 mg by mouth in the morning and at bedtime.   Yes [provider]  ?sacubitril-valsartan (ENTRESTO) 24-26 MG Take 1 tablet by mouth 2 (two) times daily.   Yes [provider]  ?cetirizine (ZYRTEC) 10 MG tablet Take 10 mg by mouth daily. ?Patient  not taking: Reported on 09/03/2021    [provider]  ?polyethylene glycol-electrolytes (NULYTELY/GOLYTELY) 420 g solution Take 4,000 mLs by mouth once. ?Patient not taking: Reported on 09/03/2021    [provider]  ? ? ?Allergies as of 06/26/2021  ? (No Known Allergies)  ? ? ?History reviewed. No pertinent family history. ? ?Social History  ? ?Socioeconomic History  ? Marital status: Married  ?  Spouse name: Not on file  ? Number of children: Not on file  ? Years of education: Not on file  ? Highest education level: Not on file  ?Occupational History  ? Not on file  ?Tobacco Use  ? Smoking status: Former  ?  Packs/day: 1.00  ?  Years: 3.00  ?  Pack years: 3.00  ?  Types: Cigarettes  ?  Quit date: 05/11/1974  ?  Years since quitting: 47.3  ? Smokeless tobacco: Never  ?Vaping Use  ? Vaping Use: Never used  ?Substance and Sexual Activity  ? Alcohol use: No  ?  Comment: seldom  ? Drug use: No  ? Sexual activity: Not on file  ?Other Topics Concern  ? Not on file  ?Social History Narrative  ? Not on file  ? ?Social Determinants of Health  ? ?Financial Resource Strain: Not on file  ?Food Insecurity: Not on file  ?Transportation Needs: Not on file  ?Physical Activity: Not on file  ?Stress: Not on file  ?Social Connections: Not  on file  ?Intimate Partner Violence: Not on file  ? ? ?Review of Systems: ?See HPI, otherwise negative ROS ? ?Physical Exam: ?BP 108/77   Pulse 60   Temp 97.6 ?F (36.4 ?C) (Temporal)   Resp 14   Ht '6\' 4"'$  (1.93 m)   Wt 115.7 kg   SpO2 95%   BMI 31.04 kg/m?  ?General:   Alert, cooperative in NAD ?Head:  Normocephalic and atraumatic. ?Respiratory:  Normal work of breathing. ?Cardiovascular:  RRR ? ?Impression/Plan: ?Luke Hess is here for cataract surgery. ? ?Risks, benefits, limitations, and alternatives regarding cataract surgery have been reviewed with the patient.  Questions have been answered.  All parties agreeable. ? ? ?Benay Pillow, MD  09/03/2021, 12:00 PM ? ? ?

## 2021-09-03 NOTE — Anesthesia Preprocedure Evaluation (Signed)
Anesthesia Evaluation  ?Patient identified by MRN, date of birth, ID band ?Patient awake ? ? ? ?Reviewed: ?Allergy & Precautions, NPO status , Patient's Chart, lab work & pertinent test results, reviewed documented beta blocker date and time  ? ?History of Anesthesia Complications ?Negative for: history of anesthetic complications ? ?Airway ?Mallampati: III ? ?TM Distance: >3 FB ?Neck ROM: Limited ? ? ? Dental ?  ?Pulmonary ?sleep apnea , former smoker,  ?  ?breath sounds clear to auscultation ? ? ? ? ? ? Cardiovascular ?Exercise Tolerance: Poor ?(-) angina+CHF and + DOE (Chronic, stable)  ?+ Cardiac Defibrillator ? ?Rhythm:Regular Rate:Normal ? ? ?HLD ? ?TTE (06/2021): ?LVEF 40%, mild LVH ?Trivial AI, trivial MR, trivial PI, trivial TR ?Bi-atrial enlargement ?Ascending aorta mildly dilated (4 cm) ? ?AICD Interrogation (04/2021): ?Stable CRT-D lead function. 5 mos battery remaining.  ?97.5% total VP  ?No interval events. No therapies delivered.  ?VS episodes consistent with ST/AT. ?  ?Neuro/Psych ? Headaches,   ? GI/Hepatic ?neg GERD  ,  ?Endo/Other  ? ? Renal/GU ?  ? ?  ?Musculoskeletal ? ?(+) Arthritis ,  ? Abdominal ?(+) + obese (BMI 32),   ?Peds ? Hematology ?  ?Anesthesia Other Findings ? ? Reproductive/Obstetrics ? ?  ? ? ? ? ? ? ? ? ? ? ? ? ? ?  ?  ? ? ? ? ? ? ? ? ?Anesthesia Physical ? ?Anesthesia Plan ? ?ASA: 3 ? ?Anesthesia Plan: MAC  ? ?Post-op Pain Management:   ? ?Induction: Intravenous ? ?PONV Risk Score and Plan: 1 and TIVA, Midazolam and Treatment may vary due to age or medical condition ? ?Airway Management Planned: Nasal Cannula ? ?Additional Equipment:  ? ?Intra-op Plan:  ? ?Post-operative Plan:  ? ?Informed Consent: I have reviewed the patients History and Physical, chart, labs and discussed the procedure including the risks, benefits and alternatives for the proposed anesthesia with the patient or authorized representative who has indicated his/her understanding  and acceptance.  ? ? ? ? ? ?Plan Discussed with: CRNA and Anesthesiologist ? ?Anesthesia Plan Comments:   ? ? ? ? ? ? ?Anesthesia Quick Evaluation ? ?

## 2021-09-03 NOTE — Anesthesia Postprocedure Evaluation (Signed)
Anesthesia Post Note ? ?Patient: Luke Hess ? ?Procedure(s) Performed: CATARACT EXTRACTION PHACO AND INTRAOCULAR LENS PLACEMENT (IOC) LEFT 3.22 00:26.0 (Left: Eye) ? ? ?  ?Patient location during evaluation: PACU ?Anesthesia Type: MAC ?Level of consciousness: awake and alert ?Pain management: pain level controlled ?Vital Signs Assessment: post-procedure vital signs reviewed and stable ?Respiratory status: spontaneous breathing, nonlabored ventilation, respiratory function stable and patient connected to nasal cannula oxygen ?Cardiovascular status: stable and blood pressure returned to baseline ?Postop Assessment: no apparent nausea or vomiting ?Anesthetic complications: no ? ? ?No notable events documented. ? ?Luke Rosenow A  Delania Hess ? ? ? ? ? ?

## 2021-09-03 NOTE — Anesthesia Procedure Notes (Signed)
Procedure Name: [place] ?Date/Time: 09/03/2021 12:14 PM ?Performed by: Cameron Ali, CRNA ?Pre-anesthesia Checklist: Patient identified, Emergency Drugs available, Suction available, Timeout performed and Patient being monitored ?Patient Re-evaluated:Patient Re-evaluated prior to induction ?Oxygen Delivery Method: Nasal cannula ?Placement Confirmation: positive ETCO2 ? ? ? ? ?

## 2021-09-24 DIAGNOSIS — G4733 Obstructive sleep apnea (adult) (pediatric): Secondary | ICD-10-CM | POA: Diagnosis not present

## 2021-09-25 DIAGNOSIS — I4729 Other ventricular tachycardia: Secondary | ICD-10-CM | POA: Diagnosis not present

## 2021-09-25 DIAGNOSIS — Z95810 Presence of automatic (implantable) cardiac defibrillator: Secondary | ICD-10-CM | POA: Diagnosis not present

## 2021-09-25 DIAGNOSIS — R9431 Abnormal electrocardiogram [ECG] [EKG]: Secondary | ICD-10-CM | POA: Diagnosis not present

## 2021-09-25 DIAGNOSIS — Z4502 Encounter for adjustment and management of automatic implantable cardiac defibrillator: Secondary | ICD-10-CM | POA: Diagnosis not present

## 2021-10-18 DIAGNOSIS — C775 Secondary and unspecified malignant neoplasm of intrapelvic lymph nodes: Secondary | ICD-10-CM | POA: Diagnosis not present

## 2021-10-18 DIAGNOSIS — I4729 Other ventricular tachycardia: Secondary | ICD-10-CM | POA: Diagnosis not present

## 2021-10-18 DIAGNOSIS — Z95810 Presence of automatic (implantable) cardiac defibrillator: Secondary | ICD-10-CM | POA: Diagnosis not present

## 2021-10-18 DIAGNOSIS — Z01818 Encounter for other preprocedural examination: Secondary | ICD-10-CM | POA: Diagnosis not present

## 2021-10-18 DIAGNOSIS — T82198D Other mechanical complication of other cardiac electronic device, subsequent encounter: Secondary | ICD-10-CM | POA: Diagnosis not present

## 2021-10-18 DIAGNOSIS — I428 Other cardiomyopathies: Secondary | ICD-10-CM | POA: Diagnosis not present

## 2021-10-18 DIAGNOSIS — C20 Malignant neoplasm of rectum: Secondary | ICD-10-CM | POA: Diagnosis not present

## 2021-10-18 DIAGNOSIS — Z9989 Dependence on other enabling machines and devices: Secondary | ICD-10-CM | POA: Diagnosis not present

## 2021-10-18 DIAGNOSIS — G4733 Obstructive sleep apnea (adult) (pediatric): Secondary | ICD-10-CM | POA: Diagnosis not present

## 2021-10-25 DIAGNOSIS — I420 Dilated cardiomyopathy: Secondary | ICD-10-CM | POA: Diagnosis not present

## 2021-10-25 DIAGNOSIS — I5022 Chronic systolic (congestive) heart failure: Secondary | ICD-10-CM | POA: Diagnosis not present

## 2021-10-25 DIAGNOSIS — I447 Left bundle-branch block, unspecified: Secondary | ICD-10-CM | POA: Diagnosis not present

## 2021-10-25 DIAGNOSIS — I4729 Other ventricular tachycardia: Secondary | ICD-10-CM | POA: Diagnosis not present

## 2021-10-25 DIAGNOSIS — G4733 Obstructive sleep apnea (adult) (pediatric): Secondary | ICD-10-CM | POA: Diagnosis not present

## 2021-10-25 DIAGNOSIS — Z87891 Personal history of nicotine dependence: Secondary | ICD-10-CM | POA: Diagnosis not present

## 2021-10-25 DIAGNOSIS — E7800 Pure hypercholesterolemia, unspecified: Secondary | ICD-10-CM | POA: Diagnosis not present

## 2021-10-25 DIAGNOSIS — Z4502 Encounter for adjustment and management of automatic implantable cardiac defibrillator: Secondary | ICD-10-CM | POA: Diagnosis not present

## 2021-10-25 DIAGNOSIS — Z95810 Presence of automatic (implantable) cardiac defibrillator: Secondary | ICD-10-CM | POA: Diagnosis not present

## 2021-10-25 DIAGNOSIS — I428 Other cardiomyopathies: Secondary | ICD-10-CM | POA: Diagnosis not present

## 2021-11-08 DIAGNOSIS — I429 Cardiomyopathy, unspecified: Secondary | ICD-10-CM | POA: Diagnosis not present

## 2021-11-08 DIAGNOSIS — I4729 Other ventricular tachycardia: Secondary | ICD-10-CM | POA: Diagnosis not present

## 2021-11-08 DIAGNOSIS — K5900 Constipation, unspecified: Secondary | ICD-10-CM | POA: Diagnosis not present

## 2021-11-08 DIAGNOSIS — R059 Cough, unspecified: Secondary | ICD-10-CM | POA: Diagnosis not present

## 2021-11-20 DIAGNOSIS — Z95810 Presence of automatic (implantable) cardiac defibrillator: Secondary | ICD-10-CM | POA: Diagnosis not present

## 2021-11-21 DIAGNOSIS — Z95810 Presence of automatic (implantable) cardiac defibrillator: Secondary | ICD-10-CM | POA: Diagnosis not present

## 2022-01-08 ENCOUNTER — Other Ambulatory Visit: Payer: Self-pay | Admitting: Otolaryngology

## 2022-01-08 ENCOUNTER — Ambulatory Visit
Admission: RE | Admit: 2022-01-08 | Discharge: 2022-01-08 | Disposition: A | Discharge status: Home / Self Care | Payer: Medicare HMO | Source: Ambulatory Visit | Attending: Otolaryngology | Admitting: Otolaryngology

## 2022-01-08 DIAGNOSIS — J340 Abscess, furuncle and carbuncle of nose: Secondary | ICD-10-CM | POA: Diagnosis not present

## 2022-01-08 DIAGNOSIS — R059 Cough, unspecified: Secondary | ICD-10-CM

## 2022-01-08 DIAGNOSIS — R053 Chronic cough: Secondary | ICD-10-CM | POA: Diagnosis not present

## 2022-01-08 DIAGNOSIS — K219 Gastro-esophageal reflux disease without esophagitis: Secondary | ICD-10-CM | POA: Diagnosis not present

## 2022-01-29 DIAGNOSIS — I428 Other cardiomyopathies: Secondary | ICD-10-CM | POA: Diagnosis not present

## 2022-01-29 DIAGNOSIS — I5022 Chronic systolic (congestive) heart failure: Secondary | ICD-10-CM | POA: Diagnosis not present

## 2022-01-29 DIAGNOSIS — I4729 Other ventricular tachycardia: Secondary | ICD-10-CM | POA: Diagnosis not present

## 2022-02-19 DIAGNOSIS — Z95810 Presence of automatic (implantable) cardiac defibrillator: Secondary | ICD-10-CM | POA: Diagnosis not present

## 2022-04-05 DIAGNOSIS — Z95810 Presence of automatic (implantable) cardiac defibrillator: Secondary | ICD-10-CM | POA: Diagnosis not present

## 2022-08-20 DIAGNOSIS — Z95810 Presence of automatic (implantable) cardiac defibrillator: Secondary | ICD-10-CM | POA: Diagnosis not present

## 2022-09-30 DIAGNOSIS — I428 Other cardiomyopathies: Secondary | ICD-10-CM | POA: Diagnosis not present

## 2022-11-05 DIAGNOSIS — Z125 Encounter for screening for malignant neoplasm of prostate: Secondary | ICD-10-CM | POA: Diagnosis not present

## 2022-11-05 DIAGNOSIS — Z79899 Other long term (current) drug therapy: Secondary | ICD-10-CM | POA: Diagnosis not present

## 2022-11-05 DIAGNOSIS — E7800 Pure hypercholesterolemia, unspecified: Secondary | ICD-10-CM | POA: Diagnosis not present

## 2022-11-14 DIAGNOSIS — E785 Hyperlipidemia, unspecified: Secondary | ICD-10-CM | POA: Diagnosis not present

## 2022-11-14 DIAGNOSIS — Z79899 Other long term (current) drug therapy: Secondary | ICD-10-CM | POA: Diagnosis not present

## 2022-11-14 DIAGNOSIS — Z1331 Encounter for screening for depression: Secondary | ICD-10-CM | POA: Diagnosis not present

## 2022-11-14 DIAGNOSIS — G4733 Obstructive sleep apnea (adult) (pediatric): Secondary | ICD-10-CM | POA: Diagnosis not present

## 2022-11-14 DIAGNOSIS — Z0000 Encounter for general adult medical examination without abnormal findings: Secondary | ICD-10-CM | POA: Diagnosis not present

## 2022-11-19 DIAGNOSIS — Z4502 Encounter for adjustment and management of automatic implantable cardiac defibrillator: Secondary | ICD-10-CM | POA: Diagnosis not present

## 2022-11-19 DIAGNOSIS — E7800 Pure hypercholesterolemia, unspecified: Secondary | ICD-10-CM | POA: Diagnosis not present

## 2022-11-19 DIAGNOSIS — I4729 Other ventricular tachycardia: Secondary | ICD-10-CM | POA: Diagnosis not present

## 2022-11-19 DIAGNOSIS — R9439 Abnormal result of other cardiovascular function study: Secondary | ICD-10-CM | POA: Diagnosis not present

## 2022-11-19 DIAGNOSIS — I428 Other cardiomyopathies: Secondary | ICD-10-CM | POA: Diagnosis not present

## 2022-11-19 DIAGNOSIS — Z95810 Presence of automatic (implantable) cardiac defibrillator: Secondary | ICD-10-CM | POA: Diagnosis not present

## 2023-01-27 DIAGNOSIS — R82998 Other abnormal findings in urine: Secondary | ICD-10-CM | POA: Diagnosis not present

## 2023-01-27 DIAGNOSIS — R310 Gross hematuria: Secondary | ICD-10-CM | POA: Diagnosis not present

## 2023-01-27 DIAGNOSIS — R319 Hematuria, unspecified: Secondary | ICD-10-CM | POA: Diagnosis not present

## 2023-01-27 DIAGNOSIS — R109 Unspecified abdominal pain: Secondary | ICD-10-CM | POA: Diagnosis not present

## 2023-01-27 DIAGNOSIS — R1084 Generalized abdominal pain: Secondary | ICD-10-CM | POA: Diagnosis not present

## 2023-02-03 ENCOUNTER — Emergency Department
Admission: EM | Admit: 2023-02-03 | Discharge: 2023-02-04 | Disposition: A | Discharge status: Home / Self Care | Payer: Medicare HMO | Attending: Emergency Medicine | Admitting: Emergency Medicine

## 2023-02-03 ENCOUNTER — Other Ambulatory Visit: Payer: Self-pay

## 2023-02-03 ENCOUNTER — Ambulatory Visit
Admission: RE | Admit: 2023-02-03 | Discharge: 2023-02-03 | Disposition: A | Discharge status: Home / Self Care | Payer: Medicare HMO | Source: Ambulatory Visit | Attending: Family Medicine | Admitting: Family Medicine

## 2023-02-03 ENCOUNTER — Other Ambulatory Visit: Payer: Self-pay | Admitting: Family Medicine

## 2023-02-03 ENCOUNTER — Emergency Department: Payer: Medicare HMO

## 2023-02-03 DIAGNOSIS — N200 Calculus of kidney: Secondary | ICD-10-CM

## 2023-02-03 DIAGNOSIS — R140 Abdominal distension (gaseous): Secondary | ICD-10-CM

## 2023-02-03 DIAGNOSIS — I700 Atherosclerosis of aorta: Secondary | ICD-10-CM | POA: Insufficient documentation

## 2023-02-03 DIAGNOSIS — R112 Nausea with vomiting, unspecified: Secondary | ICD-10-CM | POA: Insufficient documentation

## 2023-02-03 DIAGNOSIS — N132 Hydronephrosis with renal and ureteral calculous obstruction: Secondary | ICD-10-CM | POA: Diagnosis not present

## 2023-02-03 DIAGNOSIS — Z95810 Presence of automatic (implantable) cardiac defibrillator: Secondary | ICD-10-CM | POA: Insufficient documentation

## 2023-02-03 DIAGNOSIS — R1031 Right lower quadrant pain: Secondary | ICD-10-CM | POA: Diagnosis present

## 2023-02-03 DIAGNOSIS — N179 Acute kidney failure, unspecified: Secondary | ICD-10-CM | POA: Diagnosis not present

## 2023-02-03 DIAGNOSIS — K760 Fatty (change of) liver, not elsewhere classified: Secondary | ICD-10-CM | POA: Insufficient documentation

## 2023-02-03 DIAGNOSIS — I509 Heart failure, unspecified: Secondary | ICD-10-CM | POA: Diagnosis not present

## 2023-02-03 DIAGNOSIS — Z79899 Other long term (current) drug therapy: Secondary | ICD-10-CM | POA: Insufficient documentation

## 2023-02-03 DIAGNOSIS — R161 Splenomegaly, not elsewhere classified: Secondary | ICD-10-CM | POA: Insufficient documentation

## 2023-02-03 DIAGNOSIS — R109 Unspecified abdominal pain: Secondary | ICD-10-CM | POA: Diagnosis not present

## 2023-02-03 LAB — URINALYSIS, ROUTINE W REFLEX MICROSCOPIC
Bilirubin Urine: NEGATIVE
Glucose, UA: NEGATIVE mg/dL
Ketones, ur: NEGATIVE mg/dL
Leukocytes,Ua: NEGATIVE
Nitrite: NEGATIVE
Protein, ur: 30 mg/dL — AB
Specific Gravity, Urine: 1.019 (ref 1.005–1.030)
pH: 5 (ref 5.0–8.0)

## 2023-02-03 LAB — COMPREHENSIVE METABOLIC PANEL
ALT: 15 U/L (ref 0–44)
AST: 16 U/L (ref 15–41)
Albumin: 4.4 g/dL (ref 3.5–5.0)
Alkaline Phosphatase: 68 U/L (ref 38–126)
Anion gap: 11 (ref 5–15)
BUN: 26 mg/dL — ABNORMAL HIGH (ref 8–23)
CO2: 23 mmol/L (ref 22–32)
Calcium: 8.8 mg/dL — ABNORMAL LOW (ref 8.9–10.3)
Chloride: 103 mmol/L (ref 98–111)
Creatinine, Ser: 1.37 mg/dL — ABNORMAL HIGH (ref 0.61–1.24)
GFR, Estimated: 55 mL/min — ABNORMAL LOW (ref >60)
Glucose, Bld: 102 mg/dL — ABNORMAL HIGH (ref 70–99)
Potassium: 3.8 mmol/L (ref 3.5–5.1)
Sodium: 137 mmol/L (ref 135–145)
Total Bilirubin: 0.9 mg/dL (ref 0.3–1.2)
Total Protein: 7.2 g/dL (ref 6.5–8.1)

## 2023-02-03 LAB — CBC WITH DIFFERENTIAL/PLATELET
Abs Immature Granulocytes: 0.02 10*3/uL (ref 0.00–0.07)
Basophils Absolute: 0 10*3/uL (ref 0.0–0.1)
Basophils Relative: 0 %
Eosinophils Absolute: 0.1 10*3/uL (ref 0.0–0.5)
Eosinophils Relative: 1 %
HCT: 41.1 % (ref 39.0–52.0)
Hemoglobin: 13.4 g/dL (ref 13.0–17.0)
Immature Granulocytes: 0 %
Lymphocytes Relative: 8 %
Lymphs Abs: 0.5 10*3/uL — ABNORMAL LOW (ref 0.7–4.0)
MCH: 29.5 pg (ref 26.0–34.0)
MCHC: 32.6 g/dL (ref 30.0–36.0)
MCV: 90.3 fL (ref 80.0–100.0)
Monocytes Absolute: 0.7 10*3/uL (ref 0.1–1.0)
Monocytes Relative: 12 %
Neutro Abs: 4.6 10*3/uL (ref 1.7–7.7)
Neutrophils Relative %: 79 %
Platelets: 148 10*3/uL — ABNORMAL LOW (ref 150–400)
RBC: 4.55 MIL/uL (ref 4.22–5.81)
RDW: 12.5 % (ref 11.5–15.5)
WBC: 5.9 10*3/uL (ref 4.0–10.5)
nRBC: 0 % (ref 0.0–0.2)

## 2023-02-03 MED ORDER — MORPHINE SULFATE (PF) 4 MG/ML IV SOLN
4.0000 mg | Freq: Once | INTRAVENOUS | Status: DC
  Filled 2023-02-03: qty 1

## 2023-02-03 MED ORDER — ONDANSETRON HCL 4 MG/2ML IJ SOLN
4.0000 mg | Freq: Once | INTRAMUSCULAR | Status: AC
  Administered 2023-02-04: 4 mg via INTRAVENOUS
  Filled 2023-02-03: qty 2

## 2023-02-03 MED ORDER — SODIUM CHLORIDE 0.9 % IV BOLUS (SEPSIS): 1000.0000 mL | Freq: Once | INTRAVENOUS | Status: DC

## 2023-02-03 NOTE — ED Provider Notes (Signed)
[facility] Provider Note    Event Date/Time   First MD Initiated Contact with Patient 02/03/23 2303     (approximate)   History   Flank Pain   HPI  Luke Hess is a 72 y.o. male with history of CHF status post AICD, hyperlipidemia, kidney stones who presents to the emergency department with 3 weeks of right flank and right lower quadrant abdominal pain.  Reports having ultrasound done today and was told that he had an obstructing right-sided kidney stone.  Reports he was called tonight by the nurse for his PCP and told to come to the ED due to the obstruction.  He has had intermittent nausea and vomiting.  No pain with urination.  No hematuria.  He has had slightly worsening creatinine.  He is already on Flomax.   History provided by patient and wife.    Past Medical History:  Diagnosis Date   AICD (automatic cardioverter/defibrillator) present    Dr Nolon Rod, cardiologist   Allergic state    Arthritis    arms, hands   CHF (congestive heart failure) (HCC)    H/O endoscopic sinus surgery    Headache    none in last 2 years   Hypercholesteremia    Sleep apnea    Uses CPAP    Past Surgical History:  Procedure Laterality Date   CATARACT EXTRACTION W/PHACO Right 08/20/2021   Procedure: CATARACT EXTRACTION PHACO AND INTRAOCULAR LENS PLACEMENT (IOC) RIGHT 2.96 00:24.5;  Surgeon: Nevada Crane, MD;  Location: [facility];  Service: Ophthalmology;  Laterality: Right;   CATARACT EXTRACTION W/PHACO Left 09/03/2021   Procedure: CATARACT EXTRACTION PHACO AND INTRAOCULAR LENS PLACEMENT (IOC) LEFT 3.22 00:26.0;  Surgeon: Nevada Crane, MD;  Location: [facility];  Service: Ophthalmology;  Laterality: Left;  sleep apnea   COLONOSCOPY WITH PROPOFOL N/A 07/21/2015   Procedure: COLONOSCOPY WITH PROPOFOL;  Surgeon: Elnita Maxwell, MD;  Location: [facility] END[facility];  Service: Endoscopy;  Laterality: N/A;   defibrillatot placement   05/12/2014   HAND TENDON SURGERY Left 2006   HERNIA REPAIR     KNEE ARTHROSCOPY Left 1970    MEDICATIONS:  Prior to Admission medications   Medication Sig Start Date End Date Taking? Authorizing Provider  cetirizine (ZYRTEC) 10 MG tablet Take 10 mg by mouth daily. Patient not taking: Reported on 09/03/2021    [provider]  diphenhydramine-acetaminophen (TYLENOL PM) 25-500 MG TABS tablet Take 1 tablet by mouth at bedtime as needed.    [provider]  fluticasone (FLONASE) 50 MCG/ACT nasal spray Place 2 sprays into both nostrils daily.    [provider]  meloxicam (MOBIC) 15 MG tablet Take 15 mg by mouth daily.    [provider]  metoprolol succinate (TOPROL-XL) 25 MG 24 hr tablet Take 50 mg by mouth in the morning and at bedtime.    [provider]  polyethylene glycol-electrolytes (NULYTELY/GOLYTELY) 420 g solution Take 4,000 mLs by mouth once. Patient not taking: Reported on 09/03/2021    [provider]  sacubitril-valsartan (ENTRESTO) 24-26 MG Take 1 tablet by mouth 2 (two) times daily.    [provider]    Physical Exam   Triage Vital Signs: ED Triage Vitals  Encounter Vitals Group     BP 02/03/23 2154 101/63     Systolic BP Percentile --      Diastolic BP Percentile --      Pulse Rate 02/03/23 2154 65  Resp 02/03/23 2154 18     Temp 02/03/23 2154 98.5 F (36.9 C)     Temp src --      SpO2 02/03/23 2154 92 %     Weight 02/03/23 2154 255 lb (115.7 kg)     Height 02/03/23 2154 6\' 4"  (1.93 m)     Head Circumference --      Peak Flow --      Pain Score 02/03/23 2154 5     Pain Loc --      Pain Education --      Exclude from Growth Chart --     Most recent vital signs: Vitals:   02/04/23 0130 02/04/23 0200  BP: (!) 96/56 (!) 94/56  Pulse: (!) 49 (!) 56  Resp:  16  Temp:    SpO2:  95%    CONSTITUTIONAL: Alert, responds appropriately to questions. Well-appearing; well-nourished, elderly HEAD:  Normocephalic, atraumatic EYES: Conjunctivae clear, pupils appear equal, sclera nonicteric ENT: normal nose; moist mucous membranes NECK: Supple, normal ROM CARD: RRR; S1 and S2 appreciated RESP: Normal chest excursion without splinting or tachypnea; breath sounds clear and equal bilaterally; no wheezes, no rhonchi, no rales, no hypoxia or respiratory distress, speaking full sentences ABD/GI: Non-distended; soft, non-tender, no rebound, no guarding, no peritoneal signs BACK: The back appears normal EXT: Normal ROM in all joints; no deformity noted, no edema SKIN: Normal color for age and race; warm; no rash on exposed skin NEURO: Moves all extremities equally, normal speech PSYCH: The patient's mood and manner are appropriate.   ED Results / Procedures / Treatments   LABS: (all labs ordered are listed, but only abnormal results are displayed) Labs Reviewed  CBC WITH DIFFERENTIAL/PLATELET - Abnormal; Notable for the following components:      Result Value   Platelets 148 (*)    Lymphs Abs 0.5 (*)    All other components within normal limits  COMPREHENSIVE METABOLIC PANEL - Abnormal; Notable for the following components:   Glucose, Bld 102 (*)    BUN 26 (*)    Creatinine, Ser 1.37 (*)    Calcium 8.8 (*)    GFR, Estimated 55 (*)    All other components within normal limits  URINALYSIS, ROUTINE W REFLEX MICROSCOPIC - Abnormal; Notable for the following components:   Color, Urine YELLOW (*)    APPearance HAZY (*)    Hgb urine dipstick LARGE (*)    Protein, ur 30 (*)    Bacteria, UA RARE (*)    All other components within normal limits  URINE CULTURE     EKG:  RADIOLOGY: My personal review and interpretation of imaging: CT scan showed mild hydronephrosis with 7 mm proximal right ureteral stone.  I have personally reviewed all radiology reports.   CT Renal Stone Study  Result Date: 02/04/2023 CLINICAL DATA:  Right side abdominal pain. EXAM: CT ABDOMEN AND PELVIS WITHOUT  CONTRAST TECHNIQUE: Multidetector CT imaging of the abdomen and pelvis was performed following the standard protocol without IV contrast. RADIATION DOSE REDUCTION: This exam was performed according to the departmental dose-optimization program which includes automated exposure control, adjustment of the mA and/or kV according to patient size and/or use of iterative reconstruction technique. COMPARISON:  Ultrasound earlier tonight. FINDINGS: Lower chest: Linear scarring or atelectasis in the lung bases. No effusions. Hepatobiliary: No focal hepatic abnormality. Gallbladder unremarkable. Pancreas: No focal abnormality or ductal dilatation. Spleen: No focal abnormality.  Normal size. Adrenals/Urinary Tract: Mild right hydronephrosis due to 7 mm proximal  right ureteral stone. Nonobstructing stones in the left kidney, the largest 5 mm in the midpole. No hydronephrosis on the left. Adrenal glands and urinary bladder unremarkable. Stomach/Bowel: Postoperative changes in the rectosigmoid colon. Stomach, large and small bowel grossly unremarkable. Vascular/Lymphatic: Aortic atherosclerosis. No evidence of aneurysm or adenopathy. Reproductive: No visible focal abnormality. Other: No free fluid or free air. Musculoskeletal: No acute bony abnormality. IMPRESSION: 7 mm proximal right ureteral stone with mild right hydronephrosis. Left nonobstructing nephrolithiasis. Aortic atherosclerosis. Electronically Signed   By: Charlett Nose M.D.   On: 02/04/2023 01:19   US Abdomen Complete  Result Date: 02/03/2023 CLINICAL DATA:  Abdominal distention, intermittent right-sided abdominal pain, and acute kidney injury with previous history of rectosigmoid carcinoma. EXAM: ABDOMEN ULTRASOUND COMPLETE COMPARISON:  CT chest, abdomen and pelvis with IV contrast 07/26/2015 FINDINGS: Gallbladder: No gallstones or wall thickening visualized. No sonographic Murphy sign noted by sonographer. Common bile duct: Diameter: 5 mm caliber, with no  intrahepatic biliary dilatation. Liver: No focal mass identified. There is a 1.8 cm simple cyst in the left lobe of the liver, another left lobe simple cyst noted measuring 1 cm, and another inferiorly in the right lobe measuring 8.2 mm. The parenchymal echogenicity is mildly increased consistent with fatty replacement. Portal vein is patent on color Doppler imaging with normal direction of blood flow towards the liver. IVC: No abnormality visualized. Pancreas: Visualized portion unremarkable. Spleen: Mildly enlarged measuring 14.6 cm in length with uniform parenchymal echogenicity, volume 626 cm3. Right Kidney: Length: 13.5 cm. Echogenicity within normal limits. No mass is seen. Noted is a shadowing echogenic 1.1 cm stone in the proximal right ureter about 5 cm distal to the renal hilum, associated with mild-to-moderate upstream hydroureteronephrosis. No intrarenal stone is visible. Left Kidney: Length: 12.6 cm. Echogenicity within normal limits. No mass, stones or hydronephrosis visualized. There is a simple parapelvic cyst measuring 1.8 cm. Abdominal aorta: No aneurysm visualized. The aortic AP diameter is borderline at 2.8 cm. There is mild scattered echogenic calcific plaque. Other findings: No free fluid is evident. IMPRESSION: 1. Obstructing 1.1 cm stone in the proximal right ureter with mild-to-moderate upstream hydroureteronephrosis. 2. Hepatic steatosis. 3. Mild splenomegaly. 4. These results will be called to the ordering clinician or representative by the Radiologist Assistant, and communication documented in the PACS or [employer]. Electronically Signed   By: Almira Bar M.D.   On: 02/03/2023 20:12     PROCEDURES:  Critical Care performed: No   CRITICAL CARE Performed by: Baxter Hire Brinlee Gambrell   Total critical care time: 0 minutes  Critical care time was exclusive of separately billable procedures and treating other patients.  Critical care was necessary to treat or prevent imminent or  life-threatening deterioration.  Critical care was time spent personally by me on the following activities: development of treatment plan with patient and/or surrogate as well as nursing, discussions with consultants, evaluation of patient's response to treatment, examination of patient, obtaining history from patient or surrogate, ordering and performing treatments and interventions, ordering and review of laboratory studies, ordering and review of radiographic studies, pulse oximetry and re-evaluation of patient's condition.   Procedures    IMPRESSION / MDM / ASSESSMENT AND PLAN / ED COURSE  I reviewed the triage vital signs and the nursing notes.    Patient here with right flank pain and right lower quad abdominal pain x 3 weeks.    DIFFERENTIAL DIAGNOSIS (includes but not limited to):   Kidney stone, UTI, doubt appendicitis, colitis, bowel obstruction  Patient's presentation is most consistent with acute presentation with potential threat to life or bodily function.   PLAN: Labs obtained from triage show creatinine of 1.37.  This is downtrending from recent where he was high as 1.6 on 01/27/2023.  Urine shows 6-10 red blood cells and rare bacteria but no other sign of infection.  Culture is pending.  No leukocytosis today.  No fever.  Slightly low blood pressures in the low 100s but this looks like it has been present previously for patient.  CT renal study pending.  Will give pain medication here, IV hydration, Zofran.   MEDICATIONS GIVEN IN ED: Medications  ondansetron (ZOFRAN) injection 4 mg (4 mg Intravenous Given 02/04/23 0029)  sodium chloride 0.9 % bolus 500 mL (0 mLs Intravenous Stopped 02/04/23 0243)  fentaNYL (SUBLIMAZE) injection 50 mcg (50 mcg Intravenous Given 02/04/23 0049)     ED COURSE: CT scan reviewed and interpreted by myself and the radiologist and shows 7 mm proximal right ureteral stone with mild right hydronephrosis.  No signs of pyelonephritis.  Pain is  currently well-controlled.  No indication for emergent urologic consultation at this time.  Patient is comfortable with plan for discharge home with close outpatient follow-up with urology.  Recommend he avoid NSAIDs given mild elevation in his creatinine.  Recommended pain medication, nausea medicine and continuing Flomax.  Patient and wife are comfortable with this plan.   At this time, I do not feel there is any life-threatening condition present. I reviewed all nursing notes, vitals, pertinent previous records.  All lab and urine results, EKGs, imaging ordered have been independently reviewed and interpreted by myself.  I reviewed all available radiology reports from any imaging ordered this visit.  Based on my assessment, I feel the patient is safe to be discharged home without further emergent workup and can continue workup as an outpatient as needed. Discussed all findings, treatment plan as well as usual and customary return precautions.  They verbalize understanding and are comfortable with this plan.  Outpatient follow-up has been provided as needed.  All questions have been answered.    CONSULTS:  none   OUTSIDE RECORDS REVIEWED: Reviewed recent PCP notes.       FINAL CLINICAL IMPRESSION(S) / ED DIAGNOSES   Final diagnoses:  Kidney stone     Rx / DC Orders   ED Discharge Orders          Ordered    HYDROcodone-acetaminophen (NORCO/VICODIN) 5-325 MG tablet  Every 8 hours PRN        02/04/23 0137    ondansetron (ZOFRAN-ODT) 4 MG disintegrating tablet  Every 6 hours PRN        02/04/23 0137             Note:  This document was prepared using Dragon voice recognition software and may include unintentional dictation errors.   Naarah Borgerding, Layla Maw, DO 02/04/23 [phone]

## 2023-02-03 NOTE — ED Triage Notes (Addendum)
Pt was told by PCP after having ultrasound done today of his kidney that he has an obstructing stone on right side. Pt has been having pain x3weeks. Per PCP kidney function has been declining as well. Pt denies any difficulty urinating.

## 2023-02-04 DIAGNOSIS — I4729 Other ventricular tachycardia: Secondary | ICD-10-CM | POA: Diagnosis not present

## 2023-02-04 DIAGNOSIS — I428 Other cardiomyopathies: Secondary | ICD-10-CM | POA: Diagnosis not present

## 2023-02-04 DIAGNOSIS — E7800 Pure hypercholesterolemia, unspecified: Secondary | ICD-10-CM | POA: Diagnosis not present

## 2023-02-04 DIAGNOSIS — Z4502 Encounter for adjustment and management of automatic implantable cardiac defibrillator: Secondary | ICD-10-CM | POA: Diagnosis not present

## 2023-02-04 DIAGNOSIS — R9439 Abnormal result of other cardiovascular function study: Secondary | ICD-10-CM | POA: Diagnosis not present

## 2023-02-04 DIAGNOSIS — Z95810 Presence of automatic (implantable) cardiac defibrillator: Secondary | ICD-10-CM | POA: Diagnosis not present

## 2023-02-04 MED ORDER — HYDROCODONE-ACETAMINOPHEN 5-325 MG PO TABS: 2.0000 | ORAL_TABLET | Freq: Three times a day (TID) | ORAL | 0 refills | Status: DC | PRN

## 2023-02-04 MED ORDER — ONDANSETRON 4 MG PO TBDP: 4.0000 mg | ORAL_TABLET | Freq: Four times a day (QID) | ORAL | 0 refills | Status: DC | PRN

## 2023-02-04 MED ORDER — FENTANYL CITRATE PF 50 MCG/ML IJ SOSY
50.0000 ug | PREFILLED_SYRINGE | Freq: Once | INTRAMUSCULAR | Status: AC
  Administered 2023-02-04: 50 ug via INTRAVENOUS
  Filled 2023-02-04: qty 1

## 2023-02-04 MED ORDER — SODIUM CHLORIDE 0.9 % IV BOLUS
500.0000 mL | Freq: Once | INTRAVENOUS | Status: AC
  Administered 2023-02-04: 500 mL via INTRAVENOUS

## 2023-02-04 NOTE — Discharge Instructions (Addendum)

## 2023-02-05 ENCOUNTER — Encounter: Payer: Self-pay | Admitting: Urology

## 2023-02-05 ENCOUNTER — Ambulatory Visit: Payer: Medicare HMO | Admitting: Urology

## 2023-02-05 ENCOUNTER — Other Ambulatory Visit: Payer: Self-pay

## 2023-02-05 VITALS — BP 106/65 | HR 59 | Ht 76.0 in | Wt 258.0 lb

## 2023-02-05 DIAGNOSIS — N201 Calculus of ureter: Secondary | ICD-10-CM

## 2023-02-05 DIAGNOSIS — N179 Acute kidney failure, unspecified: Secondary | ICD-10-CM

## 2023-02-05 DIAGNOSIS — N200 Calculus of kidney: Secondary | ICD-10-CM | POA: Diagnosis not present

## 2023-02-05 LAB — URINALYSIS, COMPLETE
Bilirubin, UA: NEGATIVE
Glucose, UA: NEGATIVE
Ketones, UA: NEGATIVE
Leukocytes,UA: NEGATIVE
Nitrite, UA: NEGATIVE
Protein,UA: NEGATIVE
Specific Gravity, UA: 1.02 (ref 1.005–1.030)
Urobilinogen, Ur: 1 mg/dL (ref 0.2–1.0)
pH, UA: 6 (ref 5.0–7.5)

## 2023-02-05 LAB — MICROSCOPIC EXAMINATION

## 2023-02-05 LAB — URINE CULTURE: Culture: NO GROWTH

## 2023-02-05 NOTE — Progress Notes (Signed)
[name],acting as a scribe for [name], MD.,have documented all relevant documentation on the behalf of [name], MD,as directed by  [name], MD while in the presence of [name], MD.  02/05/2023 1:35 PM   Luke Hess [birth year] [id number]  Referring provider: [name], MD [phone] [address] [facility] [place],  [place] [address]  Chief Complaint  Patient presents with   Nephrolithiasis    HPI: 72 year-old male who presents today for follow up of kidney stones.   He presented in the emergency room 2 days ago with acute onset right flank pain. He was found to have a 7 mm proximal right ureteral stone with hydronephrosis. He also had a slight increase in his creatinine to 1.37. There was no concern for infection at the time. He was already on Flomax at baseline.   He presents today to discuss stone management options.   Urinalysis today shows 11-30 WBC per high powered field, otherwise unremarkable.   He has not taken any NSAIDs or blood thinners in the last 72 hours, making them a candidate for shockwave lithotripsy. He has a history of kidney stones, with the last episode occurring approximately 20 years ago, which was passed without surgical intervention. The current episode has been ongoing for three weeks, with intermittent pain primarily on the right side. He has experienced nausea and back pain associated with the stone.  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  He see's Dr. Wilford Grist.     PMH: Past Medical History:  Diagnosis Date   AICD (automatic cardioverter/defibrillator) present    Dr Nolon Rod, cardiologist   Allergic state    Arthritis    arms, hands   CHF (congestive heart failure) (HCC)    H/O endoscopic sinus surgery    Headache    none in last 2 years   Hypercholesteremia    Sleep apnea    Uses CPAP    Surgical History: Past Surgical History:  Procedure  Laterality Date   CATARACT EXTRACTION W/PHACO Right 08/20/2021   Procedure: CATARACT EXTRACTION PHACO AND INTRAOCULAR LENS PLACEMENT (IOC) RIGHT 2.96 00:24.5;  Surgeon: Nevada Crane, MD;  Location: [facility];  Service: Ophthalmology;  Laterality: Right;   CATARACT EXTRACTION W/PHACO Left 09/03/2021   Procedure: CATARACT EXTRACTION PHACO AND INTRAOCULAR LENS PLACEMENT (IOC) LEFT 3.22 00:26.0;  Surgeon: Nevada Crane, MD;  Location: [facility];  Service: Ophthalmology;  Laterality: Left;  sleep apnea   COLONOSCOPY WITH PROPOFOL N/A 07/21/2015   Procedure: COLONOSCOPY WITH PROPOFOL;  Surgeon: Elnita Maxwell, MD;  Location: [facility] END[facility]COPY;  Service: Endoscopy;  Laterality: N/A;   defibrillatot placement  05/12/2014   HAND TENDON SURGERY Left 2006   HERNIA REPAIR     KNEE ARTHROSCOPY Left 1970    Home Medications:  Allergies as of 02/05/2023   No Known Allergies      Medication List        Accurate as of February 05, 2023  1:35 PM. If you have any questions, ask your nurse or doctor.          STOP taking these medications    cetirizine 10 MG tablet Commonly known as: ZYRTEC Stopped by: [name]   meloxicam 15 MG tablet Commonly known as: MOBIC Stopped by: [name]   polyethylene glycol-electrolytes 420 g solution Commonly known as: NuLYTELY Stopped by: [name]  TAKE these medications    atorvastatin 10 MG tablet Commonly known as: LIPITOR Take 1 tablet by mouth daily.   Azelastine HCl 137 MCG/SPRAY Soln SMARTSIG:1 Spray(s) Both Nares Twice Daily PRN   diphenhydramine-acetaminophen 25-500 MG Tabs tablet Commonly known as: TYLENOL PM Take 1 tablet by mouth at bedtime as needed.   Entresto 24-26 MG Generic drug: sacubitril-valsartan Take 1 tablet by mouth 2 (two) times daily.   fluticasone 50 MCG/ACT nasal spray Commonly known as: FLONASE Place 2 sprays into both nostrils daily.   HYDROcodone-acetaminophen  5-325 MG tablet Commonly known as: NORCO/VICODIN Take 2 tablets by mouth every 8 (eight) hours as needed.   metoprolol succinate 25 MG 24 hr tablet Commonly known as: TOPROL-XL Take 50 mg by mouth in the morning and at bedtime.   ondansetron 4 MG disintegrating tablet Commonly known as: ZOFRAN-ODT Take 1 tablet (4 mg total) by mouth every 6 (six) hours as needed for nausea or vomiting.   pantoprazole 20 MG tablet Commonly known as: PROTONIX Take 1 tablet by mouth daily.   tamsulosin 0.4 MG Caps capsule Commonly known as: FLOMAX Take by mouth.        Social History:  reports that he quit smoking about 48 years ago. His smoking use included cigarettes. He started smoking about 51 years ago. He has a 3 pack-year smoking history. He has never used smokeless tobacco. He reports that he does not drink alcohol and does not use drugs.   Physical Exam: BP 106/65   Pulse (!) 59   Ht 6\' 4"  (1.93 m)   Wt 258 lb (117 kg)   BMI 31.40 kg/m   Constitutional:  Alert and oriented, No acute distress. HEENT: [place] AT, moist mucus membranes.  Trachea midline, no masses. Neurologic: Grossly intact, no focal deficits, moving all 4 extremities. Psychiatric: Normal mood and affect.   Pertinent Imaging: EXAM: CT ABDOMEN AND PELVIS WITHOUT CONTRAST  TECHNIQUE: Multidetector CT imaging of the abdomen and pelvis was performed following the standard protocol without IV contrast.  RADIATION DOSE REDUCTION: This exam was performed according to the departmental dose-optimization program which includes automated exposure control, adjustment of the mA and/or kV according to patient size and/or use of iterative reconstruction technique.  COMPARISON:  Ultrasound earlier tonight.  FINDINGS: Lower chest: Linear scarring or atelectasis in the lung bases. No effusions.  Hepatobiliary: No focal hepatic abnormality. Gallbladder unremarkable.  Pancreas: No focal abnormality or ductal  dilatation.  Spleen: No focal abnormality.  Normal size.  Adrenals/Urinary Tract: Mild right hydronephrosis due to 7 mm proximal right ureteral stone. Nonobstructing stones in the left kidney, the largest 5 mm in the midpole. No hydronephrosis on the left. Adrenal glands and urinary bladder unremarkable.  Stomach/Bowel: Postoperative changes in the rectosigmoid colon. Stomach, large and small bowel grossly unremarkable.  Vascular/Lymphatic: Aortic atherosclerosis. No evidence of aneurysm or adenopathy.  Reproductive: No visible focal abnormality.  Other: No free fluid or free air.  Musculoskeletal: No acute bony abnormality.  IMPRESSION: 7 mm proximal right ureteral stone with mild right hydronephrosis.  Left nonobstructing nephrolithiasis.  Aortic atherosclerosis.   Electronically Signed By: Charlett Nose M.D. On: 02/04/2023 01:19  This was personally reviewed and I agree with the radiologic interpretation.   Assessment & Plan:    1. Right obstructing ureteral stone, AKI - We discussed various treatment options for urolithiasis including observation with or without medical expulsive therapy, shockwave lithotripsy (SWL), ureteroscopy and laser lithotripsy with stent placement, and percutaneous nephrolithotomy.   We  discussed that management is based on stone size, location, density, patient co-morbidities, and patient preference.    Stones <56mm in size have a >80% spontaneous passage rate. Data surrounding the use of tamsulosin for medical expulsive therapy is controversial, but meta analyses suggests it is most efficacious for distal stones between 5-73mm in size. Possible side effects include dizziness/lightheadedness, and retrograde ejaculation.   SWL has a lower stone free rate in a single procedure, but also a lower complication rate compared to ureteroscopy and avoids a stent and associated stent related symptoms. Possible complications include renal hematoma,  steinstrasse, and need for additional treatment. We discussed the role of his increased skin to stone distance can lead to decreased efficacy with shockwave lithotripsy.   Ureteroscopy with laser lithotripsy and stent placement has a higher stone free rate than SWL in a single procedure, however increased complication rate including possible infection, ureteral injury, bleeding, and stent related morbidity. Common stent related symptoms include dysuria, urgency/frequency, and flank pain.   After an extensive discussion of the risks and benefits of the above treatment options, the patient would like to proceed with SWL.  - Concerns about the his defibrillator and potential anesthesia complications were discussed.  - Monitor kidney function post-procedure.  Schedule ESWL  I have reviewed the above documentation for accuracy and completeness, and I agree with the above.   [name], MD    [facility] [address], [address] [place], [place] [address] [phone]

## 2023-02-05 NOTE — H&P (View-Only) (Signed)
Luke Hess,acting as a scribe for Luke Scotland, MD.,have documented all relevant documentation on the behalf of Luke Scotland, MD,as directed by  Luke Scotland, MD while in the presence of Luke Scotland, MD.  02/05/2023 1:35 PM   Luke Hess [birth year] [id number]  Referring provider: [name], MD [phone] Luke Hess [facility] [place],  [place] [address]  Chief Complaint  Patient presents with   Nephrolithiasis    HPI: 72 year-old male who presents today for follow up of kidney stones.   He presented in the emergency room 2 days ago with acute onset right flank pain. He was found to have a 7 mm proximal right ureteral stone with hydronephrosis. He also had a slight increase in his creatinine to 1.37. There was no concern for infection at the time. He was already on Flomax at baseline.   He presents today to discuss stone management options.   Urinalysis today shows 11-30 WBC per high powered field, otherwise unremarkable.   He has not taken any NSAIDs or blood thinners in the last 72 hours, making them a candidate for shockwave lithotripsy. He has a history of kidney stones, with the last episode occurring approximately 20 years ago, which was passed without surgical intervention. The current episode has been ongoing for three weeks, with intermittent pain primarily on the right side. He has experienced nausea and back pain associated with the stone.  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  He see's Dr. Wilford Grist.     PMH: Past Medical History:  Diagnosis Date   AICD (automatic cardioverter/defibrillator) present    Dr Nolon Rod, cardiologist   Allergic state    Arthritis    arms, hands   CHF (congestive heart failure) (HCC)    H/O endoscopic sinus surgery    Headache    none in last 2 years   Hypercholesteremia    Sleep apnea    Uses CPAP    Surgical History: Past Surgical History:  Procedure  Laterality Date   CATARACT EXTRACTION W/PHACO Right 08/20/2021   Procedure: CATARACT EXTRACTION PHACO AND INTRAOCULAR LENS PLACEMENT (IOC) RIGHT 2.96 00:24.5;  Surgeon: Luke Crane, MD;  Location[facility];  Service: Ophthalmology;  Laterality: Right;   CATARACT EXTRACTION W/PHACO Left 09/03/2021   Procedure: CATARACT EXTRACTION PHACO AND INTRAOCULAR LENS PLACEMENT (IOC) LEFT 3.22 00:26.0;  Surgeon: Luke Crane, MD;  Location: [facility];  Service: Ophthalmology;  Laterality: Left;  sleep apnea   COLONOSCOPY WITH PROPOFOL N/A 07/21/2015   Procedure: COLONOSCOPY WITH PROPOFOL;  Surgeon: Luke Maxwell, MD;  Location: [facility] END[facility]COPY;  Service: Endoscopy;  Laterality: N/A;   defibrillatot placement  05/12/2014   HAND TENDON SURGERY Left 2006   HERNIA REPAIR     KNEE ARTHROSCOPY Left 1970    Home Medications:  Allergies as of 02/05/2023   No Known Allergies      Medication List        Accurate as of February 05, 2023  1:35 PM. If you have any questions, ask your nurse or doctor.          STOP taking these medications    cetirizine 10 MG tablet Commonly known as: ZYRTEC Stopped by: Luke Hess   meloxicam 15 MG tablet Commonly known as: MOBIC Stopped by: Luke Hess   polyethylene glycol-electrolytes 420 g solution Commonly known as: NuLYTELY Stopped by: Luke Hess  TAKE these medications    atorvastatin 10 MG tablet Commonly known as: LIPITOR Take 1 tablet by mouth daily.   Azelastine HCl 137 MCG/SPRAY Soln SMARTSIG:1 Spray(s) Both Nares Twice Daily PRN   diphenhydramine-acetaminophen 25-500 MG Tabs tablet Commonly known as: TYLENOL PM Take 1 tablet by mouth at bedtime as needed.   Entresto 24-26 MG Generic drug: sacubitril-valsartan Take 1 tablet by mouth 2 (two) times daily.   fluticasone 50 MCG/ACT nasal spray Commonly known as: FLONASE Place 2 sprays into both nostrils daily.   HYDROcodone-acetaminophen  5-325 MG tablet Commonly known as: NORCO/VICODIN Take 2 tablets by mouth every 8 (eight) hours as needed.   metoprolol succinate 25 MG 24 hr tablet Commonly known as: TOPROL-XL Take 50 mg by mouth in the morning and at bedtime.   ondansetron 4 MG disintegrating tablet Commonly known as: ZOFRAN-ODT Take 1 tablet (4 mg total) by mouth every 6 (six) hours as needed for nausea or vomiting.   pantoprazole 20 MG tablet Commonly known as: PROTONIX Take 1 tablet by mouth daily.   tamsulosin 0.4 MG Caps capsule Commonly known as: FLOMAX Take by mouth.        Social History:  reports that he quit smoking about 48 years ago. His smoking use included cigarettes. He started smoking about 51 years ago. He has a 3 pack-year smoking history. He has never used smokeless tobacco. He reports that he does not drink alcohol and does not use drugs.   Physical Exam: BP 106/65   Pulse (!) 59   Ht 6\' 4"  (1.93 m)   Wt 258 lb (117 kg)   BMI 31.40 kg/m   Constitutional:  Alert and oriented, No acute distress. HEENT: [place] AT, moist mucus membranes.  Trachea midline, no masses. Neurologic: Grossly intact, no focal deficits, moving all 4 extremities. Psychiatric: Normal mood and affect.   Pertinent Imaging: EXAM: CT ABDOMEN AND PELVIS WITHOUT CONTRAST  TECHNIQUE: Multidetector CT imaging of the abdomen and pelvis was performed following the standard protocol without IV contrast.  RADIATION DOSE REDUCTION: This exam was performed according to the departmental dose-optimization program which includes automated exposure control, adjustment of the mA and/or kV according to patient size and/or use of iterative reconstruction technique.  COMPARISON:  Ultrasound earlier tonight.  FINDINGS: Lower chest: Linear scarring or atelectasis in the lung bases. No effusions.  Hepatobiliary: No focal hepatic abnormality. Gallbladder unremarkable.  Pancreas: No focal abnormality or ductal  dilatation.  Spleen: No focal abnormality.  Normal size.  Adrenals/Urinary Tract: Mild right hydronephrosis due to 7 mm proximal right ureteral stone. Nonobstructing stones in the left kidney, the largest 5 mm in the midpole. No hydronephrosis on the left. Adrenal glands and urinary bladder unremarkable.  Stomach/Bowel: Postoperative changes in the rectosigmoid colon. Stomach, large and small bowel grossly unremarkable.  Vascular/Lymphatic: Aortic atherosclerosis. No evidence of aneurysm or adenopathy.  Reproductive: No visible focal abnormality.  Other: No free fluid or free air.  Musculoskeletal: No acute bony abnormality.  IMPRESSION: 7 mm proximal right ureteral stone with mild right hydronephrosis.  Left nonobstructing nephrolithiasis.  Aortic atherosclerosis.   Electronically Signed By: Charlett Nose M.D. On: 02/04/2023 01:19  This was personally reviewed and I agree with the radiologic interpretation.   Assessment & Plan:    1. Right obstructing ureteral stone, AKI - We discussed various treatment options for urolithiasis including observation with or without medical expulsive therapy, shockwave lithotripsy (SWL), ureteroscopy and laser lithotripsy with stent placement, and percutaneous nephrolithotomy.   We  discussed that management is based on stone size, location, density, patient co-morbidities, and patient preference.    Stones <56mm in size have a >80% spontaneous passage rate. Data surrounding the use of tamsulosin for medical expulsive therapy is controversial, but meta analyses suggests it is most efficacious for distal stones between 5-73mm in size. Possible side effects include dizziness/lightheadedness, and retrograde ejaculation.   SWL has a lower stone free rate in a single procedure, but also a lower complication rate compared to ureteroscopy and avoids a stent and associated stent related symptoms. Possible complications include renal hematoma,  steinstrasse, and need for additional treatment. We discussed the role of his increased skin to stone distance can lead to decreased efficacy with shockwave lithotripsy.   Ureteroscopy with laser lithotripsy and stent placement has a higher stone free rate than SWL in a single procedure, however increased complication rate including possible infection, ureteral injury, bleeding, and stent related morbidity. Common stent related symptoms include dysuria, urgency/frequency, and flank pain.   After an extensive discussion of the risks and benefits of the above treatment options, the patient would like to proceed with SWL.  - Concerns about the his defibrillator and potential anesthesia complications were discussed.  - Monitor kidney function post-procedure.  Schedule ESWL  I have reviewed the above documentation for accuracy and completeness, and I agree with the above.   Luke Scotland, MD    [facility] [address], [address] [place], [place] [address] [phone]

## 2023-02-05 NOTE — Progress Notes (Signed)
ESWL ORDER FORM  Expected date of procedure: 02/06/23  Surgeon: [name], MD  Post op standing: 2-4wk follow up w/KUB prior  Anticoagulation/Aspirin/NSAID standing order: Hold all 72 hours prior  Anesthesia standing order: MAC  VTE standing: SCD's  Dx: Right Ureteral Stone  Procedure: right Extracorporeal shock wave lithotripsy  CPT : 50590  Standing Order Set:   *NPO after mn, KUB  *NS 170ml/hr, Keflex 500mg  PO, Benadryl 25mg  PO, Valium 10mg  PO, Zofran 4mg  IV  Medications if other than standing orders:   NONE  Cards clearance

## 2023-02-06 ENCOUNTER — Encounter: Payer: Self-pay | Admitting: Urology

## 2023-02-06 ENCOUNTER — Encounter
Admission: RE | Disposition: A | Discharge status: Home / Self Care | Payer: Self-pay | Source: Home / Self Care | Attending: Urology

## 2023-02-06 ENCOUNTER — Encounter: Payer: Self-pay | Admitting: Urgent Care

## 2023-02-06 ENCOUNTER — Ambulatory Visit: Payer: Medicare HMO

## 2023-02-06 ENCOUNTER — Other Ambulatory Visit: Payer: Self-pay

## 2023-02-06 ENCOUNTER — Ambulatory Visit
Admission: RE | Admit: 2023-02-06 | Discharge: 2023-02-06 | Disposition: A | Discharge status: Home / Self Care | Payer: Medicare HMO | Attending: Urology | Admitting: Urology

## 2023-02-06 DIAGNOSIS — N179 Acute kidney failure, unspecified: Secondary | ICD-10-CM | POA: Insufficient documentation

## 2023-02-06 DIAGNOSIS — I509 Heart failure, unspecified: Secondary | ICD-10-CM | POA: Insufficient documentation

## 2023-02-06 DIAGNOSIS — Z87442 Personal history of urinary calculi: Secondary | ICD-10-CM | POA: Diagnosis not present

## 2023-02-06 DIAGNOSIS — N201 Calculus of ureter: Secondary | ICD-10-CM | POA: Diagnosis not present

## 2023-02-06 DIAGNOSIS — I878 Other specified disorders of veins: Secondary | ICD-10-CM | POA: Diagnosis not present

## 2023-02-06 DIAGNOSIS — I700 Atherosclerosis of aorta: Secondary | ICD-10-CM | POA: Insufficient documentation

## 2023-02-06 DIAGNOSIS — Z95810 Presence of automatic (implantable) cardiac defibrillator: Secondary | ICD-10-CM | POA: Insufficient documentation

## 2023-02-06 DIAGNOSIS — G4730 Sleep apnea, unspecified: Secondary | ICD-10-CM | POA: Diagnosis not present

## 2023-02-06 DIAGNOSIS — Z87891 Personal history of nicotine dependence: Secondary | ICD-10-CM | POA: Diagnosis not present

## 2023-02-06 DIAGNOSIS — N132 Hydronephrosis with renal and ureteral calculous obstruction: Secondary | ICD-10-CM | POA: Diagnosis not present

## 2023-02-06 HISTORY — PX: EXTRACORPOREAL SHOCK WAVE LITHOTRIPSY: SHX1557

## 2023-02-06 SURGERY — LITHOTRIPSY, ESWL
Anesthesia: Moderate Sedation | Laterality: Right

## 2023-02-06 MED ORDER — CEPHALEXIN 500 MG PO CAPS
500.0000 mg | ORAL_CAPSULE | Freq: Once | ORAL | Status: AC
  Administered 2023-02-06: 500 mg via ORAL

## 2023-02-06 MED ORDER — SODIUM CHLORIDE 0.9 % IV SOLN: INTRAVENOUS | Status: DC

## 2023-02-06 MED ORDER — DIPHENHYDRAMINE HCL 25 MG PO CAPS
25.0000 mg | ORAL_CAPSULE | ORAL | Status: AC
  Administered 2023-02-06: 25 mg via ORAL

## 2023-02-06 MED ORDER — ONDANSETRON HCL 4 MG/2ML IJ SOLN
INTRAMUSCULAR | Status: AC
  Filled 2023-02-06: qty 2

## 2023-02-06 MED ORDER — CEPHALEXIN 500 MG PO CAPS
ORAL_CAPSULE | ORAL | Status: AC
  Filled 2023-02-06: qty 1

## 2023-02-06 MED ORDER — DIPHENHYDRAMINE HCL 25 MG PO CAPS
ORAL_CAPSULE | ORAL | Status: AC
  Filled 2023-02-06: qty 1

## 2023-02-06 MED ORDER — DIAZEPAM 5 MG PO TABS
10.0000 mg | ORAL_TABLET | ORAL | Status: AC
  Administered 2023-02-06: 10 mg via ORAL

## 2023-02-06 MED ORDER — ONDANSETRON HCL 4 MG/2ML IJ SOLN
4.0000 mg | Freq: Once | INTRAMUSCULAR | Status: AC
  Administered 2023-02-06: 4 mg via INTRAVENOUS

## 2023-02-06 MED ORDER — DIAZEPAM 5 MG PO TABS
ORAL_TABLET | ORAL | Status: AC
  Filled 2023-02-06: qty 2

## 2023-02-06 NOTE — Interval H&P Note (Signed)
History and Physical Interval Note:  02/06/2023 1:43 PM  Luke Hess  has presented today for surgery, with the diagnosis of Right Ureteral Stone.  The various methods of treatment have been discussed with the patient and family. After consideration of risks, benefits and other options for treatment, the patient has consented to  Procedure(s): EXTRACORPOREAL SHOCK WAVE LITHOTRIPSY (ESWL) (Right) as a surgical intervention.  The patient's history has been reviewed, patient examined, no change in status, stable for surgery.  I have reviewed the patient's chart and labs.  Questions were answered to the patient's satisfaction.    RRR CTAB  [name]

## 2023-02-06 NOTE — Progress Notes (Signed)
Dr. Apolinar Junes stated patient may need to have pacemaker interrogated. I spoke with Asencion Islam (the [employer]) and he stated that patient would be ok to go home and to have his home monitor interrogate the pacemaker. He stated no further action was needed before patient was discharged. Dr. Apolinar Junes was notified and agreed.

## 2023-02-06 NOTE — Discharge Instructions (Signed)
Please refer to Discharge sheet from [facility].  AMBULATORY SURGERY  DISCHARGE INSTRUCTIONS   The drugs that you were given will stay in your system until tomorrow so for the next 24 hours you should not:  Drive an automobile Make any legal decisions Drink any alcoholic beverage   You may resume regular meals tomorrow.  Today it is better to start with liquids and gradually work up to solid foods.  You may eat anything you prefer, but it is better to start with liquids, then soup and crackers, and gradually work up to solid foods.   Please notify your doctor immediately if you have any unusual bleeding, trouble breathing, redness and pain at the surgery site, drainage, fever, or pain not relieved by medication.   Please contact your physician with any problems or Same Day Surgery at [phone], Monday through Friday 6 am to 4 pm, or [place] at [facility] number at [phone].

## 2023-02-07 ENCOUNTER — Encounter: Payer: Self-pay | Admitting: Urology

## 2023-02-09 NOTE — Plan of Care (Signed)
CHL Tonsillectomy/Adenoidectomy, Postoperative PEDS care plan entered in error.

## 2023-02-10 ENCOUNTER — Other Ambulatory Visit: Payer: Self-pay

## 2023-02-10 ENCOUNTER — Encounter: Payer: Self-pay | Admitting: Urology

## 2023-02-10 DIAGNOSIS — N200 Calculus of kidney: Secondary | ICD-10-CM

## 2023-02-18 DIAGNOSIS — Z95810 Presence of automatic (implantable) cardiac defibrillator: Secondary | ICD-10-CM | POA: Diagnosis not present

## 2023-02-18 DIAGNOSIS — I428 Other cardiomyopathies: Secondary | ICD-10-CM | POA: Diagnosis not present

## 2023-02-18 DIAGNOSIS — G4733 Obstructive sleep apnea (adult) (pediatric): Secondary | ICD-10-CM | POA: Diagnosis not present

## 2023-02-18 DIAGNOSIS — I4729 Other ventricular tachycardia: Secondary | ICD-10-CM | POA: Diagnosis not present

## 2023-02-18 DIAGNOSIS — E7800 Pure hypercholesterolemia, unspecified: Secondary | ICD-10-CM | POA: Diagnosis not present

## 2023-02-18 DIAGNOSIS — R9439 Abnormal result of other cardiovascular function study: Secondary | ICD-10-CM | POA: Diagnosis not present

## 2023-02-18 DIAGNOSIS — Z4502 Encounter for adjustment and management of automatic implantable cardiac defibrillator: Secondary | ICD-10-CM | POA: Diagnosis not present

## 2023-02-27 DIAGNOSIS — Z85828 Personal history of other malignant neoplasm of skin: Secondary | ICD-10-CM | POA: Diagnosis not present

## 2023-02-27 DIAGNOSIS — C44529 Squamous cell carcinoma of skin of other part of trunk: Secondary | ICD-10-CM | POA: Diagnosis not present

## 2023-02-27 DIAGNOSIS — C44319 Basal cell carcinoma of skin of other parts of face: Secondary | ICD-10-CM | POA: Diagnosis not present

## 2023-02-27 DIAGNOSIS — L570 Actinic keratosis: Secondary | ICD-10-CM | POA: Diagnosis not present

## 2023-02-27 DIAGNOSIS — D0462 Carcinoma in situ of skin of left upper limb, including shoulder: Secondary | ICD-10-CM | POA: Diagnosis not present

## 2023-02-27 DIAGNOSIS — L814 Other melanin hyperpigmentation: Secondary | ICD-10-CM | POA: Diagnosis not present

## 2023-02-27 DIAGNOSIS — C44519 Basal cell carcinoma of skin of other part of trunk: Secondary | ICD-10-CM | POA: Diagnosis not present

## 2023-02-28 ENCOUNTER — Ambulatory Visit: Payer: Medicare HMO | Admitting: Physician Assistant

## 2023-02-28 ENCOUNTER — Ambulatory Visit
Admission: RE | Admit: 2023-02-28 | Discharge: 2023-02-28 | Disposition: A | Discharge status: Home / Self Care | Payer: Medicare HMO | Attending: Physician Assistant | Admitting: Physician Assistant

## 2023-02-28 ENCOUNTER — Ambulatory Visit
Admission: RE | Admit: 2023-02-28 | Discharge: 2023-02-28 | Disposition: A | Discharge status: Home / Self Care | Payer: Medicare HMO | Source: Ambulatory Visit | Attending: Physician Assistant | Admitting: Physician Assistant

## 2023-02-28 VITALS — BP 105/68 | HR 59 | Ht 76.0 in | Wt 258.0 lb

## 2023-02-28 DIAGNOSIS — Z87442 Personal history of urinary calculi: Secondary | ICD-10-CM

## 2023-02-28 DIAGNOSIS — N201 Calculus of ureter: Secondary | ICD-10-CM | POA: Diagnosis not present

## 2023-02-28 DIAGNOSIS — Z09 Encounter for follow-up examination after completed treatment for conditions other than malignant neoplasm: Secondary | ICD-10-CM

## 2023-02-28 DIAGNOSIS — N200 Calculus of kidney: Secondary | ICD-10-CM | POA: Insufficient documentation

## 2023-02-28 LAB — URINALYSIS, COMPLETE
Bilirubin, UA: NEGATIVE
Glucose, UA: NEGATIVE
Ketones, UA: NEGATIVE
Leukocytes,UA: NEGATIVE
Nitrite, UA: NEGATIVE
Protein,UA: NEGATIVE
RBC, UA: NEGATIVE
Specific Gravity, UA: 1.02 (ref 1.005–1.030)
Urobilinogen, Ur: 1 mg/dL (ref 0.2–1.0)
pH, UA: 6.5 (ref 5.0–7.5)

## 2023-02-28 LAB — MICROSCOPIC EXAMINATION
Bacteria, UA: NONE SEEN
RBC, Urine: NONE SEEN /HPF (ref 0–2)

## 2023-02-28 NOTE — Progress Notes (Signed)
02/28/2023 1:46 PM   Luke Hess 1950/10/13 [id number]  CC: Chief Complaint  Patient presents with   Nephrolithiasis   HPI: Luke Hess is a 72 y.o. male with PMH nephrolithiasis who underwent ESWL with Dr. Apolinar Junes on 02/06/2023 for management of a 7 mm proximal right ureteral stone who presents today for postop follow-up.   Today he reports he passed multiple stone fragments about 3 days after his procedure and his pain has resolved.  He has no acute concerns today.  His wife request that we recheck his renal function, since his creatinine was elevated at the time of his stone episode.  KUB today with interval resolution of the proximal right ureteral stone.  In-office UA and microscopy today pan negative.  PMH: Past Medical History:  Diagnosis Date   AICD (automatic cardioverter/defibrillator) present    Dr Nolon Rod, cardiologist   Allergic state    Arthritis    arms, hands   CHF (congestive heart failure) (HCC)    H/O endoscopic sinus surgery    Headache    none in last 2 years   Hypercholesteremia    Sleep apnea    Uses CPAP    Surgical History: Past Surgical History:  Procedure Laterality Date   CATARACT EXTRACTION W/PHACO Right 08/20/2021   Procedure: CATARACT EXTRACTION PHACO AND INTRAOCULAR LENS PLACEMENT (IOC) RIGHT 2.96 00:24.5;  Surgeon: [place] Crane, MD;  Location: [facility];  Service: Ophthalmology;  Laterality: Right;   CATARACT EXTRACTION W/PHACO Left 09/03/2021   Procedure: CATARACT EXTRACTION PHACO AND INTRAOCULAR LENS PLACEMENT (IOC) LEFT 3.22 00:26.0;  Surgeon: [place] Crane, MD;  Location: [facility];  Service: Ophthalmology;  Laterality: Left;  sleep apnea   COLONOSCOPY WITH PROPOFOL N/A 07/21/2015   Procedure: COLONOSCOPY WITH PROPOFOL;  Surgeon: Elnita Maxwell, MD;  Location: [facility] ENDOSCOPY;  Service: Endoscopy;  Laterality: N/A;   defibrillatot placement  05/12/2014   EXTRACORPOREAL SHOCK WAVE  LITHOTRIPSY Right 02/06/2023   Procedure: EXTRACORPOREAL SHOCK WAVE LITHOTRIPSY (ESWL);  Surgeon: [name], MD;  Location: [facility] ORS;  Service: Urology;  Laterality: Right;   HAND TENDON SURGERY Left 2006   HERNIA REPAIR     KNEE ARTHROSCOPY Left 1970    Home Medications:  Allergies as of 02/28/2023   No Known Allergies      Medication List        Accurate as of February 28, 2023  1:46 PM. If you have any questions, ask your nurse or doctor.          atorvastatin 10 MG tablet Commonly known as: LIPITOR Take 1 tablet by mouth daily.   Azelastine HCl 137 MCG/SPRAY Soln SMARTSIG:1 Spray(s) Both Nares Twice Daily PRN   diphenhydramine-acetaminophen 25-500 MG Tabs tablet Commonly known as: TYLENOL PM Take 1 tablet by mouth at bedtime as needed.   Entresto 24-26 MG Generic drug: sacubitril-valsartan Take 1 tablet by mouth 2 (two) times daily.   fluticasone 50 MCG/ACT nasal spray Commonly known as: FLONASE Place 2 sprays into both nostrils daily.   HYDROcodone-acetaminophen 5-325 MG tablet Commonly known as: NORCO/VICODIN Take 2 tablets by mouth every 8 (eight) hours as needed.   metoprolol succinate 25 MG 24 hr tablet Commonly known as: TOPROL-XL Take 50 mg by mouth in the morning and at bedtime.   ondansetron 4 MG disintegrating tablet Commonly known as: ZOFRAN-ODT Take 1 tablet (4 mg total) by mouth every 6 (six) hours as needed for nausea or vomiting.   pantoprazole 20 MG  tablet Commonly known as: PROTONIX Take 1 tablet by mouth daily.   tamsulosin 0.4 MG Caps capsule Commonly known as: FLOMAX Take by mouth.        Allergies:  No Known Allergies  Family History: No family history on file.  Social History:   reports that he quit smoking about 48 years ago. His smoking use included cigarettes. He started smoking about 51 years ago. He has a 3 pack-year smoking history. He has never used smokeless tobacco. He reports that he does not drink  alcohol and does not use drugs.  Physical Exam: BP 105/68   Pulse (!) 59   Ht 6\' 4"  (1.93 m)   Wt 258 lb (117 kg)   BMI 31.40 kg/m   Constitutional:  Alert and oriented, no acute distress, nontoxic appearing HEENT: [place], AT Cardiovascular: No clubbing, cyanosis, or edema Respiratory: Normal respiratory effort, no increased work of breathing Skin: No rashes, bruises or suspicious lesions Neurologic: Grossly intact, no focal deficits, moving all 4 extremities Psychiatric: Normal mood and affect  Laboratory Data: Results for orders placed or performed in visit on 02/28/23  Microscopic Examination   Urine  Result Value Ref Range   WBC, UA 0-5 0 - 5 /hpf   RBC, Urine None seen 0 - 2 /hpf   Epithelial Cells (non renal) 0-10 0 - 10 /hpf   Bacteria, UA None seen None seen/Few  Urinalysis, Complete  Result Value Ref Range   Specific Gravity, UA 1.020 1.005 - 1.030   pH, UA 6.5 5.0 - 7.5   Color, UA Yellow Yellow   Appearance Ur Clear Clear   Leukocytes,UA Negative Negative   Protein,UA Negative Negative/Trace   Glucose, UA Negative Negative   Ketones, UA Negative Negative   RBC, UA Negative Negative   Bilirubin, UA Negative Negative   Urobilinogen, Ur 1.0 0.2 - 1.0 mg/dL   Nitrite, UA Negative Negative   Microscopic Examination See below:    Pertinent Imaging: KUB, 02/28/2023: See Epic  I personally reviewed the images referenced above and note interval resolution of the proximal right ureteral stone.  Assessment & Plan:   1. Kidney stones He has passed multiple fragments, UA is bland, is asymptomatic, and interval resolution of the stone on KUB.  Will send fragments for analysis and reach out via MyChart with stone prevention recommendations.  Will repeat BMP today per patient request.  If creatinine remains elevated over baseline, will plan for renal ultrasound to assess for persistent hydronephrosis.  He prefers to follow-up with [place] annually with KUB prior, which is  reasonable. - Urinalysis, Complete - Calculi, with Photograph - Basic metabolic panel - Abdomen 1 view (KUB); Future  Return in about 1 year (around 02/28/2024) for Annual stone visit with KUB prior, Will share stone analysis results via MyChart.  Carman Ching, PA-C  [facility]  [address], [address] [place], [place] [address] [phone]

## 2023-03-01 LAB — BASIC METABOLIC PANEL
BUN/Creatinine Ratio: 20 (ref 10–24)
BUN: 23 mg/dL (ref 8–27)
CO2: 21 mmol/L (ref 20–29)
Calcium: 9.4 mg/dL (ref 8.6–10.2)
Chloride: 102 mmol/L (ref 96–106)
Creatinine, Ser: 1.13 mg/dL (ref 0.76–1.27)
Glucose: 88 mg/dL (ref 70–99)
Potassium: 4.9 mmol/L (ref 3.5–5.2)
Sodium: 141 mmol/L (ref 134–144)
eGFR: 69 mL/min/{1.73_m2} (ref >59)

## 2023-03-10 LAB — CALCULI, WITH PHOTOGRAPH (CLINICAL LAB)
Calcium Oxalate Monohydrate: 100 %
Weight Calculi: 132 mg

## 2023-04-29 DIAGNOSIS — C44319 Basal cell carcinoma of skin of other parts of face: Secondary | ICD-10-CM | POA: Diagnosis not present

## 2023-04-29 DIAGNOSIS — Z85828 Personal history of other malignant neoplasm of skin: Secondary | ICD-10-CM | POA: Diagnosis not present

## 2023-04-30 DIAGNOSIS — L821 Other seborrheic keratosis: Secondary | ICD-10-CM | POA: Diagnosis not present

## 2023-04-30 DIAGNOSIS — Z85828 Personal history of other malignant neoplasm of skin: Secondary | ICD-10-CM | POA: Diagnosis not present

## 2023-04-30 DIAGNOSIS — C44629 Squamous cell carcinoma of skin of left upper limb, including shoulder: Secondary | ICD-10-CM | POA: Diagnosis not present

## 2023-04-30 DIAGNOSIS — C44722 Squamous cell carcinoma of skin of right lower limb, including hip: Secondary | ICD-10-CM | POA: Diagnosis not present

## 2023-04-30 DIAGNOSIS — L814 Other melanin hyperpigmentation: Secondary | ICD-10-CM | POA: Diagnosis not present

## 2023-04-30 DIAGNOSIS — D0462 Carcinoma in situ of skin of left upper limb, including shoulder: Secondary | ICD-10-CM | POA: Diagnosis not present

## 2023-04-30 DIAGNOSIS — C44219 Basal cell carcinoma of skin of left ear and external auricular canal: Secondary | ICD-10-CM | POA: Diagnosis not present

## 2023-04-30 DIAGNOSIS — D0461 Carcinoma in situ of skin of right upper limb, including shoulder: Secondary | ICD-10-CM | POA: Diagnosis not present

## 2023-04-30 DIAGNOSIS — L570 Actinic keratosis: Secondary | ICD-10-CM | POA: Diagnosis not present

## 2023-05-12 DIAGNOSIS — E7800 Pure hypercholesterolemia, unspecified: Secondary | ICD-10-CM | POA: Diagnosis not present

## 2023-05-12 DIAGNOSIS — Z79899 Other long term (current) drug therapy: Secondary | ICD-10-CM | POA: Diagnosis not present

## 2023-05-19 DIAGNOSIS — G4733 Obstructive sleep apnea (adult) (pediatric): Secondary | ICD-10-CM | POA: Diagnosis not present

## 2023-05-19 DIAGNOSIS — Z125 Encounter for screening for malignant neoplasm of prostate: Secondary | ICD-10-CM | POA: Diagnosis not present

## 2023-05-19 DIAGNOSIS — Z79899 Other long term (current) drug therapy: Secondary | ICD-10-CM | POA: Diagnosis not present

## 2023-05-19 DIAGNOSIS — I429 Cardiomyopathy, unspecified: Secondary | ICD-10-CM | POA: Diagnosis not present

## 2023-05-19 DIAGNOSIS — E785 Hyperlipidemia, unspecified: Secondary | ICD-10-CM | POA: Diagnosis not present

## 2023-05-20 DIAGNOSIS — Z4502 Encounter for adjustment and management of automatic implantable cardiac defibrillator: Secondary | ICD-10-CM | POA: Diagnosis not present

## 2023-05-20 DIAGNOSIS — I4729 Other ventricular tachycardia: Secondary | ICD-10-CM | POA: Diagnosis not present

## 2023-05-20 DIAGNOSIS — R9439 Abnormal result of other cardiovascular function study: Secondary | ICD-10-CM | POA: Diagnosis not present

## 2023-05-20 DIAGNOSIS — G4733 Obstructive sleep apnea (adult) (pediatric): Secondary | ICD-10-CM | POA: Diagnosis not present

## 2023-05-20 DIAGNOSIS — I428 Other cardiomyopathies: Secondary | ICD-10-CM | POA: Diagnosis not present

## 2023-05-20 DIAGNOSIS — E7800 Pure hypercholesterolemia, unspecified: Secondary | ICD-10-CM | POA: Diagnosis not present

## 2023-05-20 DIAGNOSIS — Z95810 Presence of automatic (implantable) cardiac defibrillator: Secondary | ICD-10-CM | POA: Diagnosis not present

## 2023-06-03 DIAGNOSIS — Z85828 Personal history of other malignant neoplasm of skin: Secondary | ICD-10-CM | POA: Diagnosis not present

## 2023-06-03 DIAGNOSIS — C44629 Squamous cell carcinoma of skin of left upper limb, including shoulder: Secondary | ICD-10-CM | POA: Diagnosis not present

## 2023-06-03 DIAGNOSIS — C44219 Basal cell carcinoma of skin of left ear and external auricular canal: Secondary | ICD-10-CM | POA: Diagnosis not present

## 2023-07-29 DIAGNOSIS — L570 Actinic keratosis: Secondary | ICD-10-CM | POA: Diagnosis not present

## 2023-07-29 DIAGNOSIS — L821 Other seborrheic keratosis: Secondary | ICD-10-CM | POA: Diagnosis not present

## 2023-07-29 DIAGNOSIS — D0461 Carcinoma in situ of skin of right upper limb, including shoulder: Secondary | ICD-10-CM | POA: Diagnosis not present

## 2023-07-29 DIAGNOSIS — Z85828 Personal history of other malignant neoplasm of skin: Secondary | ICD-10-CM | POA: Diagnosis not present

## 2023-07-29 DIAGNOSIS — C44729 Squamous cell carcinoma of skin of left lower limb, including hip: Secondary | ICD-10-CM | POA: Diagnosis not present

## 2023-07-29 DIAGNOSIS — D225 Melanocytic nevi of trunk: Secondary | ICD-10-CM | POA: Diagnosis not present

## 2023-07-29 DIAGNOSIS — C44612 Basal cell carcinoma of skin of right upper limb, including shoulder: Secondary | ICD-10-CM | POA: Diagnosis not present

## 2023-07-29 DIAGNOSIS — L814 Other melanin hyperpigmentation: Secondary | ICD-10-CM | POA: Diagnosis not present

## 2023-08-11 ENCOUNTER — Other Ambulatory Visit: Payer: Self-pay

## 2023-08-11 ENCOUNTER — Encounter: Payer: Self-pay | Admitting: Urology

## 2023-08-11 ENCOUNTER — Other Ambulatory Visit
Admission: RE | Admit: 2023-08-11 | Discharge: 2023-08-11 | Disposition: A | Discharge status: Home / Self Care | Source: Home / Self Care | Attending: Urology | Admitting: Urology

## 2023-08-11 ENCOUNTER — Ambulatory Visit
Admission: RE | Admit: 2023-08-11 | Discharge: 2023-08-11 | Disposition: A | Discharge status: Home / Self Care | Attending: Urology | Admitting: Urology

## 2023-08-11 ENCOUNTER — Ambulatory Visit
Admission: RE | Admit: 2023-08-11 | Discharge: 2023-08-11 | Disposition: A | Discharge status: Home / Self Care | Source: Ambulatory Visit | Attending: Urology | Admitting: Urology

## 2023-08-11 ENCOUNTER — Ambulatory Visit (INDEPENDENT_AMBULATORY_CARE_PROVIDER_SITE_OTHER): Admitting: Urology

## 2023-08-11 VITALS — BP 98/60 | HR 72 | Temp 97.7°F | Ht 76.0 in | Wt 253.0 lb

## 2023-08-11 DIAGNOSIS — R109 Unspecified abdominal pain: Secondary | ICD-10-CM | POA: Diagnosis not present

## 2023-08-11 DIAGNOSIS — N200 Calculus of kidney: Secondary | ICD-10-CM

## 2023-08-11 DIAGNOSIS — Z87442 Personal history of urinary calculi: Secondary | ICD-10-CM

## 2023-08-11 DIAGNOSIS — N138 Other obstructive and reflux uropathy: Secondary | ICD-10-CM | POA: Diagnosis not present

## 2023-08-11 DIAGNOSIS — N401 Enlarged prostate with lower urinary tract symptoms: Secondary | ICD-10-CM | POA: Diagnosis not present

## 2023-08-11 LAB — URINALYSIS, COMPLETE (UACMP) WITH MICROSCOPIC
Glucose, UA: NEGATIVE mg/dL
Hgb urine dipstick: NEGATIVE
Ketones, ur: 15 mg/dL — AB
Leukocytes,Ua: NEGATIVE
Nitrite: NEGATIVE
Protein, ur: 100 mg/dL — AB
Specific Gravity, Urine: 1.02 (ref 1.005–1.030)
pH: 5.5 (ref 5.0–8.0)

## 2023-08-11 NOTE — Progress Notes (Signed)
 08/11/2023 1:28 PM   [name] 06/27/1950 [id number]  Referring provider: Nestor Banter, MD [phone] [address] [facility] [place],  [place] [address]  Urological history: 1.  Nephrolithiasis - Stone composition of 100% calcium oxalate monohydrate+ - Right ESWL (01/2023)  - CT (01/2023) -right proximal ureteral stone, which was treated with ESWL, and nonobstructing stones in the left kidney  2. BPH with LU TS -PSA (10/2022) 1.35 - Tamsulosin 0.4 mg daily  Chief Complaint  Patient presents with   Flank Pain    HPI: Luke Hess is a 73 y.o. man who presents today for pain in the left back near waist for about a week with his wife, Luke Hess.    Previous records reviewed.   For about the last week, he has been having pain in his left lower back.  He feels like is reminiscent of previous stones.  It does not bother him if he is sitting or laying down.  It does bother him with movement.  Patient denies any modifying or aggravating factors.  Patient denies any recent UTI's, gross hematuria, dysuria or suprapubic/flank pain.  Patient denies any fevers, chills, nausea or vomiting.    He has noted some slowing of his urinary stream.  He is taking tamsulosin 0.4 mg daily.  UA yellow clear, specific gravity, pH 5.5, small bilirubin, 15 ketones, 100 protein, few bacteria, mucus present and hyaline casts present.    KUB no stones seen but he has several pelvic phleboliths.    PMH: Past Medical History:  Diagnosis Date   AICD (automatic cardioverter/defibrillator) present    Dr Aram Knights, cardiologist   Allergic state    Arthritis    arms, hands   CHF (congestive heart failure) (HCC)    H/O endoscopic sinus surgery    Headache    none in last 2 years   Hypercholesteremia    Sleep apnea    Uses CPAP    Surgical History: Past Surgical History:  Procedure Laterality Date   CATARACT EXTRACTION W/PHACO Right 08/20/2021   Procedure:  CATARACT EXTRACTION PHACO AND INTRAOCULAR LENS PLACEMENT (IOC) RIGHT 2.96 00:24.5;  Surgeon: Rosa College, MD;  Location: [facility];  Service: Ophthalmology;  Laterality: Right;   CATARACT EXTRACTION W/PHACO Left 09/03/2021   Procedure: CATARACT EXTRACTION PHACO AND INTRAOCULAR LENS PLACEMENT (IOC) LEFT 3.22 00:26.0;  Surgeon: Rosa College, MD;  Location: [facility];  Service: Ophthalmology;  Laterality: Left;  sleep apnea   COLONOSCOPY WITH PROPOFOL  N/A 07/21/2015   Procedure: COLONOSCOPY WITH PROPOFOL ;  Surgeon: Luella Sager, MD;  Location: [facility];  Service: Endoscopy;  Laterality: N/A;   defibrillatot placement  05/12/2014   EXTRACORPOREAL SHOCK WAVE LITHOTRIPSY Right 02/06/2023   Procedure: EXTRACORPOREAL SHOCK WAVE LITHOTRIPSY (ESWL);  Surgeon: Dustin Gimenez, MD;  Location: [facility];  Service: Urology;  Laterality: Right;   HAND TENDON SURGERY Left 2006   HERNIA REPAIR     KNEE ARTHROSCOPY Left 1970    Home Medications:  Allergies as of 08/11/2023   No Known Allergies      Medication List        Accurate as of August 11, 2023  1:28 PM. If you have any questions, ask your nurse or doctor.          STOP taking these medications    fluticasone 50 MCG/ACT nasal spray Commonly known as: FLONASE Stopped by: Matilde Son   HYDROcodone -acetaminophen  5-325 MG tablet Commonly  known as: NORCO/VICODIN Stopped by: Matilde Son   ondansetron  4 MG disintegrating tablet Commonly known as: ZOFRAN -ODT Stopped by: Cathleen Coach Emigdio Wildeman       TAKE these medications    atorvastatin 10 MG tablet Commonly known as: LIPITOR Take 1 tablet by mouth daily.   Azelastine HCl 137 MCG/SPRAY Soln SMARTSIG:1 Spray(s) Both Nares Twice Daily PRN   diphenhydramine -acetaminophen  25-500 MG Tabs tablet Commonly known as: TYLENOL  PM Take 1 tablet by mouth at bedtime as needed.   Entresto 24-26 MG Generic drug: sacubitril-valsartan Take 1 tablet by  mouth 2 (two) times daily.   metoprolol succinate 25 MG 24 hr tablet Commonly known as: TOPROL-XL Take 50 mg by mouth in the morning and at bedtime.   pantoprazole 20 MG tablet Commonly known as: PROTONIX Take 1 tablet by mouth daily.   tamsulosin 0.4 MG Caps capsule Commonly known as: FLOMAX Take by mouth.        Allergies: No Known Allergies  Family History: No family history on file.  Social History:  reports that he quit smoking about 49 years ago. His smoking use included cigarettes. He started smoking about 52 years ago. He has a 3 pack-year smoking history. He has never used smokeless tobacco. He reports that he does not drink alcohol and does not use drugs.  ROS: Pertinent ROS in HPI  Physical Exam: BP 98/60   Pulse 72   Temp 97.7 F (36.5 C) (Oral)   Ht 6\' 4"  (1.93 m)   Wt 253 lb (114.8 kg)   BMI 30.80 kg/m   Constitutional:  Well nourished. Alert and oriented, No acute distress. HEENT: [place] AT, moist mucus membranes.  Trachea midline Cardiovascular: No clubbing, cyanosis, or edema. Respiratory: Normal respiratory effort, no increased work of breathing. Neurologic: Grossly intact, no focal deficits, moving all 4 extremities. Psychiatric: Normal mood and affect.  Laboratory Data: Lab Results  Component Value Date   WBC 5.9 02/03/2023   HGB 13.4 02/03/2023   HCT 41.1 02/03/2023   MCV 90.3 02/03/2023   PLT 148 (L) 02/03/2023   Lab Results  Component Value Date   CREATININE 1.13 02/28/2023   Lab Results  Component Value Date   AST 16 02/03/2023   Lab Results  Component Value Date   ALT 15 02/03/2023   Urinalysis See EPIC and HPI  I have reviewed the labs.   Pertinent Imaging: KUB no stones seen, several pelvic phleboliths I have independently reviewed the films.  See HPI.  Radiologist interpretation still pending.  Assessment & Plan:    1. Left flank pain - I discussed with him and his wife that with his urine being bland, no other  systemic symptoms such as fever or gross hematuria and that the pain abates when he is still and only occurs with movement makes me think it is more muscle skeletal in nature versus stone.  Patient would like to proceed with a CT renal stone study at this time for further evaluation of this pain because he has had a history of having larger obstructing stones that required ESWL for definitive treatment - He will reach out to us  if he passes a stone prior to the CT scan or if his symptoms worsen or if he develops new symptoms such as fever, chills, gross hematuria, nausea or vomiting  2. BPH with LU TS - With the slowing of the urinary stream, this may be a sign of worsening BPH, we will reassess once CT scan is completed - He will  continue with tamsulosin 0.4 mg daily  Return in about 2 weeks (around 08/25/2023) for CT results .  These notes generated with voice recognition software. I apologize for typographical errors.  Briant Camper  [facility] [address]  [address] [place], [place] [address] [phone]

## 2023-08-11 NOTE — Addendum Note (Signed)
 Addended by: Austin Blumenthal V on: 08/11/2023 11:00 AM   Modules accepted: Orders

## 2023-08-11 NOTE — Telephone Encounter (Signed)
 Pt scheduled to see Matilde Son, PA, today with UA, UCX, KUB. Pt aware.

## 2023-08-12 LAB — URINE CULTURE: Culture: NO GROWTH

## 2023-08-14 DIAGNOSIS — R3989 Other symptoms and signs involving the genitourinary system: Secondary | ICD-10-CM | POA: Diagnosis not present

## 2023-08-14 DIAGNOSIS — N179 Acute kidney failure, unspecified: Secondary | ICD-10-CM | POA: Diagnosis not present

## 2023-08-14 DIAGNOSIS — M545 Low back pain, unspecified: Secondary | ICD-10-CM | POA: Diagnosis not present

## 2023-08-18 ENCOUNTER — Ambulatory Visit

## 2023-08-18 DIAGNOSIS — M545 Low back pain, unspecified: Secondary | ICD-10-CM | POA: Diagnosis not present

## 2023-08-19 DIAGNOSIS — I428 Other cardiomyopathies: Secondary | ICD-10-CM | POA: Diagnosis not present

## 2023-08-19 DIAGNOSIS — I4729 Other ventricular tachycardia: Secondary | ICD-10-CM | POA: Diagnosis not present

## 2023-08-19 DIAGNOSIS — Z4502 Encounter for adjustment and management of automatic implantable cardiac defibrillator: Secondary | ICD-10-CM | POA: Diagnosis not present

## 2023-08-19 DIAGNOSIS — E7800 Pure hypercholesterolemia, unspecified: Secondary | ICD-10-CM | POA: Diagnosis not present

## 2023-08-19 DIAGNOSIS — R9439 Abnormal result of other cardiovascular function study: Secondary | ICD-10-CM | POA: Diagnosis not present

## 2023-08-19 DIAGNOSIS — G4733 Obstructive sleep apnea (adult) (pediatric): Secondary | ICD-10-CM | POA: Diagnosis not present

## 2023-08-19 DIAGNOSIS — Z95810 Presence of automatic (implantable) cardiac defibrillator: Secondary | ICD-10-CM | POA: Diagnosis not present

## 2023-08-22 ENCOUNTER — Other Ambulatory Visit (HOSPITAL_COMMUNITY): Payer: Self-pay | Admitting: Orthopedic Surgery

## 2023-08-22 ENCOUNTER — Other Ambulatory Visit: Payer: Self-pay | Admitting: Orthopedic Surgery

## 2023-08-22 DIAGNOSIS — M545 Low back pain, unspecified: Secondary | ICD-10-CM

## 2023-08-28 ENCOUNTER — Ambulatory Visit

## 2023-08-28 ENCOUNTER — Telehealth: Payer: Self-pay | Admitting: Urology

## 2023-08-28 NOTE — Telephone Encounter (Signed)
 Patient's wife called to cancel appointment with Cathleen Coach for 09/01/23. Patient did not have CT done due to advice of Ortho doctor. They feel the issue is with his back, so Ortho has ordered an MRI for patient, and they are going to have this done.

## 2023-09-01 ENCOUNTER — Ambulatory Visit: Admitting: Urology

## 2023-09-11 NOTE — CV Procedure (Signed)
  Device system confirmed to be MRI conditional, with implant date > 6 weeks ago, and no evidence of abandoned or epicardial leads in review of most recent CXR  Device last cleared by EP Provider: Creighton Doffing 09/10/23  Clearance is good through for 1 year as long as parameters remain stable at time of check. If pt undergoes a cardiac device procedure during that time, they should be re-cleared.   Tachy-therapies to be programmed off if applicable with device back to pre-MRI settings after completion of exam.  [employer] - Programming recommendation received through [employer] App/Tablet  Arlys Lamer, RT  09/11/2023 9:05 AM

## 2023-09-16 ENCOUNTER — Ambulatory Visit (HOSPITAL_COMMUNITY)
Admission: RE | Admit: 2023-09-16 | Discharge: 2023-09-16 | Disposition: A | Discharge status: Home / Self Care | Source: Ambulatory Visit | Attending: Orthopedic Surgery | Admitting: Orthopedic Surgery

## 2023-09-16 DIAGNOSIS — M47816 Spondylosis without myelopathy or radiculopathy, lumbar region: Secondary | ICD-10-CM | POA: Diagnosis not present

## 2023-09-16 DIAGNOSIS — M5136 Other intervertebral disc degeneration, lumbar region with discogenic back pain only: Secondary | ICD-10-CM | POA: Diagnosis not present

## 2023-09-16 DIAGNOSIS — M545 Low back pain, unspecified: Secondary | ICD-10-CM | POA: Insufficient documentation

## 2023-09-16 DIAGNOSIS — M48061 Spinal stenosis, lumbar region without neurogenic claudication: Secondary | ICD-10-CM | POA: Diagnosis not present

## 2023-09-16 DIAGNOSIS — M5126 Other intervertebral disc displacement, lumbar region: Secondary | ICD-10-CM | POA: Diagnosis not present

## 2023-09-19 DIAGNOSIS — M545 Low back pain, unspecified: Secondary | ICD-10-CM | POA: Diagnosis not present

## 2023-09-22 DIAGNOSIS — M48061 Spinal stenosis, lumbar region without neurogenic claudication: Secondary | ICD-10-CM | POA: Diagnosis not present

## 2023-09-22 DIAGNOSIS — M5416 Radiculopathy, lumbar region: Secondary | ICD-10-CM | POA: Diagnosis not present

## 2023-09-29 DIAGNOSIS — J301 Allergic rhinitis due to pollen: Secondary | ICD-10-CM | POA: Diagnosis not present

## 2023-10-02 DIAGNOSIS — M48061 Spinal stenosis, lumbar region without neurogenic claudication: Secondary | ICD-10-CM | POA: Diagnosis not present

## 2023-10-02 DIAGNOSIS — M5416 Radiculopathy, lumbar region: Secondary | ICD-10-CM | POA: Diagnosis not present

## 2023-10-08 DIAGNOSIS — M48061 Spinal stenosis, lumbar region without neurogenic claudication: Secondary | ICD-10-CM | POA: Diagnosis not present

## 2023-10-08 DIAGNOSIS — M5416 Radiculopathy, lumbar region: Secondary | ICD-10-CM | POA: Diagnosis not present

## 2023-10-14 DIAGNOSIS — M48061 Spinal stenosis, lumbar region without neurogenic claudication: Secondary | ICD-10-CM | POA: Diagnosis not present

## 2023-10-14 DIAGNOSIS — M5416 Radiculopathy, lumbar region: Secondary | ICD-10-CM | POA: Diagnosis not present

## 2023-11-07 DIAGNOSIS — M5416 Radiculopathy, lumbar region: Secondary | ICD-10-CM | POA: Diagnosis not present

## 2023-11-11 DIAGNOSIS — E7800 Pure hypercholesterolemia, unspecified: Secondary | ICD-10-CM | POA: Diagnosis not present

## 2023-11-11 DIAGNOSIS — Z79899 Other long term (current) drug therapy: Secondary | ICD-10-CM | POA: Diagnosis not present

## 2023-11-11 DIAGNOSIS — Z125 Encounter for screening for malignant neoplasm of prostate: Secondary | ICD-10-CM | POA: Diagnosis not present

## 2023-11-13 DIAGNOSIS — I429 Cardiomyopathy, unspecified: Secondary | ICD-10-CM | POA: Diagnosis not present

## 2023-11-13 DIAGNOSIS — I5020 Unspecified systolic (congestive) heart failure: Secondary | ICD-10-CM | POA: Diagnosis not present

## 2023-11-18 DIAGNOSIS — Z95810 Presence of automatic (implantable) cardiac defibrillator: Secondary | ICD-10-CM | POA: Diagnosis not present

## 2023-11-18 DIAGNOSIS — Z4502 Encounter for adjustment and management of automatic implantable cardiac defibrillator: Secondary | ICD-10-CM | POA: Diagnosis not present

## 2023-11-18 DIAGNOSIS — Z1211 Encounter for screening for malignant neoplasm of colon: Secondary | ICD-10-CM | POA: Diagnosis not present

## 2023-11-18 DIAGNOSIS — Z1331 Encounter for screening for depression: Secondary | ICD-10-CM | POA: Diagnosis not present

## 2023-11-18 DIAGNOSIS — I428 Other cardiomyopathies: Secondary | ICD-10-CM | POA: Diagnosis not present

## 2023-11-18 DIAGNOSIS — E7800 Pure hypercholesterolemia, unspecified: Secondary | ICD-10-CM | POA: Diagnosis not present

## 2023-11-18 DIAGNOSIS — Z0000 Encounter for general adult medical examination without abnormal findings: Secondary | ICD-10-CM | POA: Diagnosis not present

## 2023-11-18 DIAGNOSIS — G4733 Obstructive sleep apnea (adult) (pediatric): Secondary | ICD-10-CM | POA: Diagnosis not present

## 2023-11-18 DIAGNOSIS — I4729 Other ventricular tachycardia: Secondary | ICD-10-CM | POA: Diagnosis not present

## 2023-11-18 DIAGNOSIS — R9439 Abnormal result of other cardiovascular function study: Secondary | ICD-10-CM | POA: Diagnosis not present

## 2023-11-20 DIAGNOSIS — I429 Cardiomyopathy, unspecified: Secondary | ICD-10-CM | POA: Diagnosis not present

## 2023-11-20 DIAGNOSIS — I5020 Unspecified systolic (congestive) heart failure: Secondary | ICD-10-CM | POA: Diagnosis not present

## 2023-12-04 DIAGNOSIS — Z85038 Personal history of other malignant neoplasm of large intestine: Secondary | ICD-10-CM | POA: Diagnosis not present

## 2023-12-04 DIAGNOSIS — D649 Anemia, unspecified: Secondary | ICD-10-CM | POA: Diagnosis not present

## 2024-02-03 DIAGNOSIS — I4729 Other ventricular tachycardia: Secondary | ICD-10-CM | POA: Diagnosis not present

## 2024-02-03 DIAGNOSIS — I428 Other cardiomyopathies: Secondary | ICD-10-CM | POA: Diagnosis not present

## 2024-02-03 DIAGNOSIS — I5020 Unspecified systolic (congestive) heart failure: Secondary | ICD-10-CM | POA: Diagnosis not present

## 2024-02-03 DIAGNOSIS — Z95810 Presence of automatic (implantable) cardiac defibrillator: Secondary | ICD-10-CM | POA: Diagnosis not present

## 2024-02-03 DIAGNOSIS — G4733 Obstructive sleep apnea (adult) (pediatric): Secondary | ICD-10-CM | POA: Diagnosis not present

## 2024-02-03 DIAGNOSIS — Z4502 Encounter for adjustment and management of automatic implantable cardiac defibrillator: Secondary | ICD-10-CM | POA: Diagnosis not present

## 2024-02-03 DIAGNOSIS — E7800 Pure hypercholesterolemia, unspecified: Secondary | ICD-10-CM | POA: Diagnosis not present

## 2024-02-03 DIAGNOSIS — R9439 Abnormal result of other cardiovascular function study: Secondary | ICD-10-CM | POA: Diagnosis not present

## 2024-02-17 DIAGNOSIS — Z4502 Encounter for adjustment and management of automatic implantable cardiac defibrillator: Secondary | ICD-10-CM | POA: Diagnosis not present

## 2024-02-17 DIAGNOSIS — Z95810 Presence of automatic (implantable) cardiac defibrillator: Secondary | ICD-10-CM | POA: Diagnosis not present

## 2024-02-17 DIAGNOSIS — G4733 Obstructive sleep apnea (adult) (pediatric): Secondary | ICD-10-CM | POA: Diagnosis not present

## 2024-02-17 DIAGNOSIS — I4729 Other ventricular tachycardia: Secondary | ICD-10-CM | POA: Diagnosis not present

## 2024-02-17 DIAGNOSIS — E7800 Pure hypercholesterolemia, unspecified: Secondary | ICD-10-CM | POA: Diagnosis not present

## 2024-02-17 DIAGNOSIS — R9439 Abnormal result of other cardiovascular function study: Secondary | ICD-10-CM | POA: Diagnosis not present

## 2024-02-17 DIAGNOSIS — I428 Other cardiomyopathies: Secondary | ICD-10-CM | POA: Diagnosis not present

## 2024-02-23 DIAGNOSIS — D485 Neoplasm of uncertain behavior of skin: Secondary | ICD-10-CM | POA: Diagnosis not present

## 2024-02-23 DIAGNOSIS — L814 Other melanin hyperpigmentation: Secondary | ICD-10-CM | POA: Diagnosis not present

## 2024-02-23 DIAGNOSIS — Z85828 Personal history of other malignant neoplasm of skin: Secondary | ICD-10-CM | POA: Diagnosis not present

## 2024-02-23 DIAGNOSIS — L821 Other seborrheic keratosis: Secondary | ICD-10-CM | POA: Diagnosis not present

## 2024-02-23 DIAGNOSIS — D0472 Carcinoma in situ of skin of left lower limb, including hip: Secondary | ICD-10-CM | POA: Diagnosis not present

## 2024-02-23 DIAGNOSIS — D1801 Hemangioma of skin and subcutaneous tissue: Secondary | ICD-10-CM | POA: Diagnosis not present

## 2024-02-23 DIAGNOSIS — B078 Other viral warts: Secondary | ICD-10-CM | POA: Diagnosis not present

## 2024-02-23 DIAGNOSIS — L570 Actinic keratosis: Secondary | ICD-10-CM | POA: Diagnosis not present

## 2024-03-01 ENCOUNTER — Ambulatory Visit: Payer: Self-pay | Admitting: Physician Assistant
